# Patient Record
Sex: Male | Born: 1995 | Race: Black or African American | Hispanic: No | Marital: Single | Smoking: Never smoker
Health system: Southern US, Community
[De-identification: ages and names within clinical notes are randomized; demographics above are authoritative.]

## PROBLEM LIST (undated history)

## (undated) DIAGNOSIS — J45909 Unspecified asthma, uncomplicated: Secondary | ICD-10-CM

## (undated) DIAGNOSIS — R933 Abnormal findings on diagnostic imaging of other parts of digestive tract: Secondary | ICD-10-CM

## (undated) DIAGNOSIS — K5090 Crohn's disease, unspecified, without complications: Secondary | ICD-10-CM

---

## 2016-08-14 ENCOUNTER — Encounter (HOSPITAL_COMMUNITY): Payer: Self-pay | Admitting: Emergency Medicine

## 2016-08-14 ENCOUNTER — Ambulatory Visit (HOSPITAL_COMMUNITY)
Admission: EM | Admit: 2016-08-14 | Discharge: 2016-08-14 | Disposition: A | Discharge status: Home / Self Care | Payer: 59 | Attending: Family Medicine | Admitting: Family Medicine

## 2016-08-14 DIAGNOSIS — J029 Acute pharyngitis, unspecified: Secondary | ICD-10-CM | POA: Diagnosis not present

## 2016-08-14 HISTORY — DX: Unspecified asthma, uncomplicated: J45.909

## 2016-08-14 LAB — POCT RAPID STREP A: Streptococcus, Group A Screen (Direct): NEGATIVE

## 2016-08-14 MED ORDER — AMOXICILLIN 500 MG PO CAPS: 1000.0000 mg | ORAL_CAPSULE | Freq: Two times a day (BID) | ORAL | 0 refills | Status: DC

## 2016-08-14 NOTE — ED Provider Notes (Signed)
CSN: [id number]     Arrival date & time 08/14/16  1613 History   First MD Initiated Contact with Patient 08/14/16 1704     Chief Complaint  Patient presents with  . Sore Throat   (Consider location/radiation/quality/duration/timing/severity/associated sxs/prior Treatment) 20 year old male presents to urgent care complaining of a swollen tonsil, headache, fatigue, sore throat started yesterday. Denies known fevers.      Past Medical History:  Diagnosis Date  . Asthma    History reviewed. No pertinent surgical history. No family history on file. Social History  Substance Use Topics  . Smoking status: Never Smoker  . Smokeless tobacco: Never Used  . Alcohol use No    Review of Systems  Constitutional: Positive for activity change and fever.  HENT: Positive for sore throat and trouble swallowing. Negative for congestion, postnasal drip and sinus pressure.   Eyes: Negative.   Respiratory: Negative.   Gastrointestinal: Negative.   Skin: Negative.   All other systems reviewed and are negative.   Allergies  Review of patient's allergies indicates no known allergies.  Home Medications   Prior to Admission medications   Medication Sig Start Date End Date Taking? Authorizing Provider  amoxicillin (AMOXIL) 500 MG capsule Take 2 capsules (1,000 mg total) by mouth 2 (two) times daily. 08/14/16   Hayden Rasmussenavid Elta Angell, NP   Meds Ordered and Administered this Visit  Medications - No data to display  BP 129/89 (BP Location: Left Arm)   Pulse 100   Temp 99.8 F (37.7 C) (Oral)   Resp 16   SpO2 98%  No data found.   Physical Exam  Constitutional: He appears well-developed and well-nourished. No distress.  HENT:  Head: Normocephalic and atraumatic.  Bilateral TMs are normal. Oropharynx with deep erythema and few small exudates. Mildly enlarged erythematous tonsils.  Eyes: EOM are normal. Pupils are equal, round, and reactive to light.  Neck: Normal range of motion. Neck supple.   Cardiovascular: Normal rate and regular rhythm.   Pulmonary/Chest: Effort normal and breath sounds normal.  Musculoskeletal: Normal range of motion.  Skin: Skin is warm and dry.  Nursing note and vitals reviewed.   Urgent Care Course   Clinical Course    Procedures (including critical care time)  Labs Review Labs Reviewed  POCT RAPID STREP A   Results for orders placed or performed during the hospital encounter of 08/14/16  POCT rapid strep A [facility])  Result Value Ref Range   Streptococcus, Group A Screen (Direct) NEGATIVE NEGATIVE     Imaging Review No results found.   Visual Acuity Review  Right Eye Distance:   Left Eye Distance:   Bilateral Distance:    Right Eye Near:   Left Eye Near:    Bilateral Near:         MDM   1. Pharyngitis   2. Sore throat    Ibuprofen 600 mg every 6 hours as needed for pain or fever Plenty of cool liquids Meds ordered this encounter  Medications  . amoxicillin (AMOXIL) 500 MG capsule    Sig: Take 2 capsules (1,000 mg total) by mouth 2 (two) times daily.    Dispense:  32 capsule    Refill:  0    Order Specific Question:   Supervising Provider    Answer:   Linna HoffKINDL, JAMES D [[id number]]       Hayden Rasmussenavid Axzel [place], NP 08/14/16 1716    Hayden Rasmussenavid Tysheka Fanguy, NP 08/14/16 1718

## 2016-08-14 NOTE — Discharge Instructions (Signed)
Ibuprofen 600 mg every 6 hours as needed for pain or fever Plenty of cool liquids

## 2016-08-17 LAB — CULTURE, GROUP A STREP (THRC)

## 2016-12-27 ENCOUNTER — Emergency Department (HOSPITAL_COMMUNITY)
Admission: EM | Admit: 2016-12-27 | Discharge: 2016-12-28 | Disposition: A | Discharge status: Home / Self Care | Payer: 59 | Attending: Emergency Medicine | Admitting: Emergency Medicine

## 2016-12-27 ENCOUNTER — Encounter (HOSPITAL_COMMUNITY): Payer: Self-pay | Admitting: Emergency Medicine

## 2016-12-27 DIAGNOSIS — Z79899 Other long term (current) drug therapy: Secondary | ICD-10-CM | POA: Insufficient documentation

## 2016-12-27 DIAGNOSIS — J45909 Unspecified asthma, uncomplicated: Secondary | ICD-10-CM | POA: Diagnosis not present

## 2016-12-27 DIAGNOSIS — K5090 Crohn's disease, unspecified, without complications: Secondary | ICD-10-CM | POA: Diagnosis not present

## 2016-12-27 DIAGNOSIS — R1030 Lower abdominal pain, unspecified: Secondary | ICD-10-CM | POA: Diagnosis not present

## 2016-12-27 HISTORY — DX: Crohn's disease, unspecified, without complications: K50.90

## 2016-12-27 HISTORY — DX: Abnormal findings on diagnostic imaging of other parts of digestive tract: R93.3

## 2016-12-27 LAB — COMPREHENSIVE METABOLIC PANEL
ALBUMIN: 3.9 g/dL (ref 3.5–5.0)
ALT: 12 U/L — ABNORMAL LOW (ref 17–63)
AST: 16 U/L (ref 15–41)
Alkaline Phosphatase: 62 U/L (ref 38–126)
Anion gap: 10 (ref 5–15)
BUN: 6 mg/dL (ref 6–20)
CHLORIDE: 101 mmol/L (ref 101–111)
CO2: 27 mmol/L (ref 22–32)
Calcium: 9.5 mg/dL (ref 8.9–10.3)
Creatinine, Ser: 1.04 mg/dL (ref 0.61–1.24)
GFR calc Af Amer: >60 mL/min (ref >60)
GFR calc non Af Amer: >60 mL/min (ref >60)
GLUCOSE: 133 mg/dL — AB (ref 65–99)
POTASSIUM: 3.4 mmol/L — AB (ref 3.5–5.1)
SODIUM: 138 mmol/L (ref 135–145)
Total Bilirubin: 1.1 mg/dL (ref 0.3–1.2)
Total Protein: 6.9 g/dL (ref 6.5–8.1)

## 2016-12-27 LAB — CBC
HEMATOCRIT: 41.7 % (ref 39.0–52.0)
Hemoglobin: 14 g/dL (ref 13.0–17.0)
MCH: 26.6 pg (ref 26.0–34.0)
MCHC: 33.6 g/dL (ref 30.0–36.0)
MCV: 79.3 fL (ref 78.0–100.0)
Platelets: 313 10*3/uL (ref 150–400)
RBC: 5.26 MIL/uL (ref 4.22–5.81)
RDW: 13 % (ref 11.5–15.5)
WBC: 7.6 10*3/uL (ref 4.0–10.5)

## 2016-12-27 LAB — LIPASE, BLOOD: LIPASE: 26 U/L (ref 11–51)

## 2016-12-27 MED ORDER — MORPHINE SULFATE (PF) 4 MG/ML IV SOLN
4.0000 mg | Freq: Once | INTRAVENOUS | Status: AC
  Administered 2016-12-27: 4 mg via INTRAVENOUS
  Filled 2016-12-27: qty 1

## 2016-12-27 MED ORDER — IOPAMIDOL (ISOVUE-300) INJECTION 61%
INTRAVENOUS | Status: AC
  Administered 2016-12-28: 100 mL
  Filled 2016-12-27: qty 30

## 2016-12-27 MED ORDER — ONDANSETRON HCL 4 MG/2ML IJ SOLN
4.0000 mg | Freq: Once | INTRAMUSCULAR | Status: AC
  Administered 2016-12-27: 4 mg via INTRAVENOUS
  Filled 2016-12-27: qty 2

## 2016-12-27 MED ORDER — SODIUM CHLORIDE 0.9 % IV BOLUS (SEPSIS)
1000.0000 mL | Freq: Once | INTRAVENOUS | Status: AC
  Administered 2016-12-27: 1000 mL via INTRAVENOUS

## 2016-12-27 NOTE — ED Triage Notes (Signed)
Pt recently dx with crohn's ds last week. Having abd pain, dizziness, and weakness-- not eating much, having diarrhea with blood.

## 2016-12-27 NOTE — ED Provider Notes (Signed)
[facility] Provider Note   CSN: [id number] Arrival date & time: 12/27/16  1630     History   Chief Complaint Chief Complaint  Patient presents with  . Crohn's Disease  . Abdominal Pain    HPI Jared Marshall is a 21 y.o. male.  Patient is a 21 year old male who was recently diagnosed with Crohn's disease. He states he's had intermittent abdominal pain and diarrhea for years but has become worse in the last few months. he's had gastroenterology and was started on prednisone and Bentyl on Friday when he was diagnosed with Crohn's disease. He is continued to have worsening lower abdominal pain, vomiting and bloody diarrhea all week.  The pain is sharp and 10 out of 10 at times. It is in the same location that it normally is more severe. He spoke with GI today who recommended he come here for further evaluation. He denies fever or urinary symptoms. Has only been able to eat a few crackers or bread intermittently.   The history is provided by the patient.  Abdominal Pain   This is a recurrent problem.    Past Medical History:  Diagnosis Date  . Abnormal colonoscopy   . Asthma   . Crohn's disease (HCC)     There are no active problems to display for this patient.   History reviewed. No pertinent surgical history.     Home Medications    Prior to Admission medications   Medication Sig Start Date End Date Taking? Authorizing Provider  amoxicillin (AMOXIL) 500 MG capsule Take 2 capsules (1,000 mg total) by mouth 2 (two) times daily. 08/14/16   Hayden Rasmussen, NP    Family History No family history on file.  Social History Social History  Substance Use Topics  . Smoking status: Never Smoker  . Smokeless tobacco: Never Used  . Alcohol use No     Allergies   Patient has no known allergies.   Review of Systems Review of Systems  Gastrointestinal: Positive for abdominal pain.  All other systems reviewed and are negative.    Physical Exam Updated Vital  Signs BP 137/95 (BP Location: Left Arm)   Pulse 69   Temp 98.5 F (36.9 C) (Oral)   Resp 18   Ht 5\' 10"  (1.778 m)   Wt 156 lb (70.8 kg)   SpO2 100%   BMI 22.38 kg/m   Physical Exam  Constitutional: He is oriented to person, place, and time. He appears well-developed and well-nourished. No distress.  HENT:  Head: Normocephalic and atraumatic.  Mouth/Throat: Oropharynx is clear and moist.  Eyes: Conjunctivae and EOM are normal. Pupils are equal, round, and reactive to light.  Neck: Normal range of motion. Neck supple.  Cardiovascular: Normal rate, regular rhythm and intact distal pulses.   No murmur heard. Pulmonary/Chest: Effort normal and breath sounds normal. No respiratory distress. He has no wheezes. He has no rales.  Abdominal: Soft. He exhibits no distension. There is tenderness in the suprapubic area and left lower quadrant. There is guarding. There is no rebound.    Musculoskeletal: Normal range of motion. He exhibits no edema or tenderness.  Neurological: He is alert and oriented to person, place, and time.  Skin: Skin is warm and dry. No rash noted. No erythema.  Psychiatric: He has a normal mood and affect. His behavior is normal.  Nursing note and vitals reviewed.    ED Treatments / Results  Labs (all labs ordered are listed, but only abnormal results are displayed)  Labs Reviewed  COMPREHENSIVE METABOLIC PANEL - Abnormal; Notable for the following:       Result Value   Potassium 3.4 (*)    Glucose, Bld 133 (*)    ALT 12 (*)    All other components within normal limits  LIPASE, BLOOD  CBC  URINALYSIS, ROUTINE W REFLEX MICROSCOPIC    EKG  EKG Interpretation None       Radiology No results found.  Procedures Procedures (including critical care time)  Medications Ordered in ED Medications  ondansetron (ZOFRAN) injection 4 mg (not administered)  morphine 4 MG/ML injection 4 mg (not administered)  sodium chloride 0.9 % bolus 1,000 mL (not  administered)     Initial Impression / Assessment and Plan / ED Course  I have reviewed the triage vital signs and the nursing notes.  Pertinent labs & imaging results that were available during my care of the patient were reviewed by me and considered in my medical decision making (see chart for details).  Clinical Course    Patient is a 21 year old male with a history of Crohn's disease presenting today with a Crohn's flare. He has been taking prednisone and Bentyl without improvement of symptoms and he continues to have diarrhea and vomiting. Patient has significant lower abdominal tenderness despite starting the prednisone. He has had vomiting and diarrhea today.  Labs without significant findings and patient is afebrile. Will do a CT for further evaluation. Patient given IV fluids pain and nausea medication.  11:51 PM Pt checked out to Dr. Mora Bellman  Final Clinical Impressions(s) / ED Diagnoses   Final diagnoses:  None    New Prescriptions New Prescriptions   No medications on file     Gwyneth Sprout, MD 12/27/16 2351

## 2016-12-28 ENCOUNTER — Encounter (HOSPITAL_COMMUNITY): Payer: Self-pay

## 2016-12-28 ENCOUNTER — Emergency Department (HOSPITAL_COMMUNITY): Payer: 59

## 2016-12-28 MED ORDER — MORPHINE SULFATE (PF) 4 MG/ML IV SOLN
4.0000 mg | Freq: Once | INTRAVENOUS | Status: AC
  Administered 2016-12-28: 4 mg via INTRAVENOUS
  Filled 2016-12-28: qty 1

## 2016-12-28 MED ORDER — METRONIDAZOLE 500 MG PO TABS
500.0000 mg | ORAL_TABLET | Freq: Three times a day (TID) | ORAL | 0 refills | Status: DC
Start: 2016-12-28 — End: 2017-01-30

## 2016-12-28 MED ORDER — CIPROFLOXACIN HCL 500 MG PO TABS: 500.0000 mg | ORAL_TABLET | Freq: Two times a day (BID) | ORAL | 0 refills | Status: DC

## 2016-12-28 MED ORDER — IOPAMIDOL (ISOVUE-300) INJECTION 61%
INTRAVENOUS | Status: AC
  Administered 2016-12-28: 03:00:00 via INTRAVENOUS
  Filled 2016-12-28: qty 100

## 2016-12-28 NOTE — ED Notes (Signed)
PO contrast finished, CT notified.

## 2016-12-28 NOTE — ED Provider Notes (Signed)
I was signed out patient as pending CT scan for evaluation.  CT reveals inflammation.  This could be from Crohns or from an infectious source.  Likely Crohns and patient is on prednisone.  He may just need continued steroids.  I will give rx for abx in case this does not improve in the next couple of days.  He demonstrates good understanding of the plan.  He appears well and in NAD. VS remain within his normal limits and she is safe for DC.   Tomasita Crumble, MD 12/28/16 [phone]

## 2017-01-30 ENCOUNTER — Emergency Department (HOSPITAL_COMMUNITY): Payer: 59

## 2017-01-30 ENCOUNTER — Emergency Department (HOSPITAL_COMMUNITY): Payer: 59 | Admitting: Certified Registered Nurse Anesthetist

## 2017-01-30 ENCOUNTER — Inpatient Hospital Stay (HOSPITAL_COMMUNITY)
Admission: EM | Admit: 2017-01-30 | Discharge: 2017-02-02 | DRG: 345 | Disposition: A | Discharge status: Home / Self Care | Payer: 59 | Attending: Surgery | Admitting: Surgery

## 2017-01-30 ENCOUNTER — Encounter (HOSPITAL_COMMUNITY): Admission: EM | Disposition: A | Discharge status: Home / Self Care | Payer: Self-pay | Source: Home / Self Care

## 2017-01-30 ENCOUNTER — Encounter (HOSPITAL_COMMUNITY): Payer: Self-pay | Admitting: Emergency Medicine

## 2017-01-30 DIAGNOSIS — K612 Anorectal abscess: Secondary | ICD-10-CM | POA: Diagnosis not present

## 2017-01-30 DIAGNOSIS — K610 Anal abscess: Secondary | ICD-10-CM

## 2017-01-30 DIAGNOSIS — Z79899 Other long term (current) drug therapy: Secondary | ICD-10-CM

## 2017-01-30 DIAGNOSIS — K611 Rectal abscess: Secondary | ICD-10-CM | POA: Diagnosis present

## 2017-01-30 DIAGNOSIS — K5900 Constipation, unspecified: Secondary | ICD-10-CM | POA: Diagnosis present

## 2017-01-30 DIAGNOSIS — D649 Anemia, unspecified: Secondary | ICD-10-CM | POA: Diagnosis present

## 2017-01-30 DIAGNOSIS — K5090 Crohn's disease, unspecified, without complications: Secondary | ICD-10-CM | POA: Diagnosis present

## 2017-01-30 DIAGNOSIS — Z7952 Long term (current) use of systemic steroids: Secondary | ICD-10-CM

## 2017-01-30 HISTORY — PX: INCISION AND DRAINAGE PERIRECTAL ABSCESS: SHX1804

## 2017-01-30 LAB — CBC WITH DIFFERENTIAL/PLATELET
Basophils Absolute: 0 10*3/uL (ref 0.0–0.1)
Basophils Relative: 0 %
EOS ABS: 0 10*3/uL (ref 0.0–0.7)
EOS PCT: 0 %
HCT: 28.5 % — ABNORMAL LOW (ref 39.0–52.0)
Hemoglobin: 8.8 g/dL — ABNORMAL LOW (ref 13.0–17.0)
LYMPHS ABS: 1.8 10*3/uL (ref 0.7–4.0)
Lymphocytes Relative: 10 %
MCH: 23.8 pg — AB (ref 26.0–34.0)
MCHC: 30.9 g/dL (ref 30.0–36.0)
MCV: 77 fL — ABNORMAL LOW (ref 78.0–100.0)
MONOS PCT: 9 %
Monocytes Absolute: 1.6 10*3/uL — ABNORMAL HIGH (ref 0.1–1.0)
Neutro Abs: 15.4 10*3/uL — ABNORMAL HIGH (ref 1.7–7.7)
Neutrophils Relative %: 81 %
PLATELETS: 361 10*3/uL (ref 150–400)
RBC: 3.7 MIL/uL — ABNORMAL LOW (ref 4.22–5.81)
RDW: 14.5 % (ref 11.5–15.5)
WBC: 18.9 10*3/uL — ABNORMAL HIGH (ref 4.0–10.5)

## 2017-01-30 LAB — BASIC METABOLIC PANEL
Anion gap: 6 (ref 5–15)
BUN: 7 mg/dL (ref 6–20)
CO2: 30 mmol/L (ref 22–32)
CREATININE: 0.85 mg/dL (ref 0.61–1.24)
Calcium: 8.6 mg/dL — ABNORMAL LOW (ref 8.9–10.3)
Chloride: 103 mmol/L (ref 101–111)
GFR calc Af Amer: >60 mL/min (ref >60)
Glucose, Bld: 83 mg/dL (ref 65–99)
Potassium: 3.3 mmol/L — ABNORMAL LOW (ref 3.5–5.1)
SODIUM: 139 mmol/L (ref 135–145)

## 2017-01-30 SURGERY — INCISION AND DRAINAGE, ABSCESS, PERIRECTAL
Anesthesia: General

## 2017-01-30 MED ORDER — PIPERACILLIN-TAZOBACTAM 3.375 G IVPB
INTRAVENOUS | Status: AC
  Filled 2017-01-30: qty 50

## 2017-01-30 MED ORDER — MORPHINE SULFATE (PF) 4 MG/ML IV SOLN
1.0000 mg | INTRAVENOUS | Status: DC | PRN
  Administered 2017-01-30 – 2017-01-31 (×3): 1 mg via INTRAVENOUS
  Filled 2017-01-30 (×3): qty 1

## 2017-01-30 MED ORDER — PROCHLORPERAZINE EDISYLATE 5 MG/ML IJ SOLN: 10.0000 mg | Freq: Once | INTRAMUSCULAR | Status: DC

## 2017-01-30 MED ORDER — SODIUM CHLORIDE 0.9 % IJ SOLN
INTRAMUSCULAR | Status: AC
  Filled 2017-01-30: qty 50

## 2017-01-30 MED ORDER — FENTANYL CITRATE (PF) 100 MCG/2ML IJ SOLN
INTRAMUSCULAR | Status: DC | PRN
  Administered 2017-01-30 (×2): 100 ug via INTRAVENOUS

## 2017-01-30 MED ORDER — MIDAZOLAM HCL 5 MG/5ML IJ SOLN
INTRAMUSCULAR | Status: DC | PRN
  Administered 2017-01-30: 2 mg via INTRAVENOUS

## 2017-01-30 MED ORDER — PROPOFOL 10 MG/ML IV BOLUS
INTRAVENOUS | Status: DC | PRN
  Administered 2017-01-30: 200 mg via INTRAVENOUS

## 2017-01-30 MED ORDER — LIDOCAINE 2% (20 MG/ML) 5 ML SYRINGE
INTRAMUSCULAR | Status: DC | PRN
  Administered 2017-01-30: 50 mg via INTRAVENOUS

## 2017-01-30 MED ORDER — IOPAMIDOL (ISOVUE-300) INJECTION 61%
INTRAVENOUS | Status: AC
  Filled 2017-01-30: qty 100

## 2017-01-30 MED ORDER — MIDAZOLAM HCL 2 MG/2ML IJ SOLN
INTRAMUSCULAR | Status: AC
  Filled 2017-01-30: qty 2

## 2017-01-30 MED ORDER — FENTANYL CITRATE (PF) 100 MCG/2ML IJ SOLN
INTRAMUSCULAR | Status: AC
  Filled 2017-01-30: qty 2

## 2017-01-30 MED ORDER — PIPERACILLIN-TAZOBACTAM 3.375 G IVPB
3.3750 g | Freq: Three times a day (TID) | INTRAVENOUS | Status: DC
  Administered 2017-01-31 – 2017-02-02 (×7): 3.375 g via INTRAVENOUS
  Filled 2017-01-30 (×9): qty 50

## 2017-01-30 MED ORDER — IOPAMIDOL (ISOVUE-300) INJECTION 61%
30.0000 mL | Freq: Once | INTRAVENOUS | Status: AC
  Administered 2017-01-30: 30 mL via ORAL

## 2017-01-30 MED ORDER — PROMETHAZINE HCL 25 MG/ML IJ SOLN: 6.2500 mg | INTRAMUSCULAR | Status: DC | PRN

## 2017-01-30 MED ORDER — DIPHENHYDRAMINE HCL 25 MG PO CAPS
25.0000 mg | ORAL_CAPSULE | Freq: Once | ORAL | Status: AC
  Administered 2017-01-30: 25 mg via ORAL
  Filled 2017-01-30: qty 1

## 2017-01-30 MED ORDER — PROPOFOL 10 MG/ML IV BOLUS
INTRAVENOUS | Status: AC
  Filled 2017-01-30: qty 20

## 2017-01-30 MED ORDER — DEXMEDETOMIDINE HCL 200 MCG/2ML IV SOLN
INTRAVENOUS | Status: DC | PRN
  Administered 2017-01-30: 20 ug via INTRAVENOUS

## 2017-01-30 MED ORDER — ONDANSETRON HCL 4 MG/2ML IJ SOLN
INTRAMUSCULAR | Status: DC | PRN
  Administered 2017-01-30: 4 mg via INTRAVENOUS

## 2017-01-30 MED ORDER — PROCHLORPERAZINE MALEATE 5 MG PO TABS
5.0000 mg | ORAL_TABLET | Freq: Once | ORAL | Status: AC
Start: 2017-01-30 — End: 2017-01-30
  Administered 2017-01-30: 5 mg via ORAL
  Filled 2017-01-30: qty 1

## 2017-01-30 MED ORDER — ACETAMINOPHEN 500 MG PO TABS
500.0000 mg | ORAL_TABLET | Freq: Once | ORAL | Status: AC
  Administered 2017-01-30: 500 mg via ORAL
  Filled 2017-01-30: qty 1

## 2017-01-30 MED ORDER — LIP MEDEX EX OINT
TOPICAL_OINTMENT | CUTANEOUS | Status: AC
  Filled 2017-01-30: qty 7

## 2017-01-30 MED ORDER — IOPAMIDOL (ISOVUE-300) INJECTION 61%
INTRAVENOUS | Status: AC
  Filled 2017-01-30: qty 30

## 2017-01-30 MED ORDER — SODIUM CHLORIDE 0.9 % IV BOLUS (SEPSIS)
1000.0000 mL | Freq: Once | INTRAVENOUS | Status: AC
  Administered 2017-01-30: 1000 mL via INTRAVENOUS

## 2017-01-30 MED ORDER — PIPERACILLIN-TAZOBACTAM 3.375 G IVPB
3.3750 g | Freq: Once | INTRAVENOUS | Status: AC
  Administered 2017-01-30: 3.375 g via INTRAVENOUS

## 2017-01-30 MED ORDER — IOPAMIDOL (ISOVUE-300) INJECTION 61%
100.0000 mL | Freq: Once | INTRAVENOUS | Status: AC | PRN
  Administered 2017-01-30: 100 mL via INTRAVENOUS

## 2017-01-30 MED ORDER — SUCCINYLCHOLINE CHLORIDE 200 MG/10ML IV SOSY
PREFILLED_SYRINGE | INTRAVENOUS | Status: DC | PRN
  Administered 2017-01-30: 100 mg via INTRAVENOUS

## 2017-01-30 MED ORDER — LACTATED RINGERS IV SOLN
INTRAVENOUS | Status: DC | PRN
  Administered 2017-01-30 (×2): via INTRAVENOUS

## 2017-01-30 MED ORDER — PREDNISONE 20 MG PO TABS
20.0000 mg | ORAL_TABLET | Freq: Two times a day (BID) | ORAL | Status: DC
  Administered 2017-01-31: 20 mg via ORAL
  Filled 2017-01-30: qty 1

## 2017-01-30 MED ORDER — OXYCODONE HCL 5 MG PO TABS
5.0000 mg | ORAL_TABLET | Freq: Once | ORAL | Status: AC
  Administered 2017-01-30: 5 mg via ORAL
  Filled 2017-01-30: qty 1

## 2017-01-30 MED ORDER — DEXMEDETOMIDINE HCL IN NACL 200 MCG/50ML IV SOLN
INTRAVENOUS | Status: AC
  Filled 2017-01-30: qty 50

## 2017-01-30 MED ORDER — HYDROMORPHONE HCL 1 MG/ML IJ SOLN: 0.2500 mg | INTRAMUSCULAR | Status: DC | PRN

## 2017-01-30 SURGICAL SUPPLY — 18 items
BLADE SURG SZ20 CARB STEEL (BLADE) IMPLANT
COVER SURGICAL LIGHT HANDLE (MISCELLANEOUS) IMPLANT
DRAPE SHEET LG 3/4 BI-LAMINATE (DRAPES) ×2 IMPLANT
ELECT PENCIL ROCKER SW 15FT (MISCELLANEOUS) IMPLANT
GAUZE IODOFORM PACK 1/2 7832 (GAUZE/BANDAGES/DRESSINGS) IMPLANT
GAUZE SPONGE 4X4 12PLY STRL (GAUZE/BANDAGES/DRESSINGS) IMPLANT
GAUZE SPONGE 4X4 16PLY XRAY LF (GAUZE/BANDAGES/DRESSINGS) IMPLANT
GLOVE BIOGEL M 8.0 STRL (GLOVE) ×2 IMPLANT
GLOVE BIOGEL PI IND STRL 7.0 (GLOVE) IMPLANT
GLOVE BIOGEL PI INDICATOR 7.0 (GLOVE)
GOWN SPEC L4 XLG W/TWL (GOWN DISPOSABLE) IMPLANT
GOWN STRL REUS W/TWL LRG LVL3 (GOWN DISPOSABLE) ×4 IMPLANT
GOWN STRL REUS W/TWL XL LVL3 (GOWN DISPOSABLE) IMPLANT
KIT BASIN OR (CUSTOM PROCEDURE TRAY) ×2 IMPLANT
PACK LITHOTOMY IV (CUSTOM PROCEDURE TRAY) ×2 IMPLANT
SWAB CULTURE ESWAB REG 1ML (MISCELLANEOUS) ×2 IMPLANT
TOWEL OR 17X26 10 PK STRL BLUE (TOWEL DISPOSABLE) ×2 IMPLANT
YANKAUER SUCT BULB TIP 10FT TU (MISCELLANEOUS) IMPLANT

## 2017-01-30 NOTE — Anesthesia Postprocedure Evaluation (Signed)
Anesthesia Post Note  Patient: Jared Marshall  Procedure(s) Performed: Procedure(s) (LRB): EXAM UNDER ANESTHESIA, IRRIGATION AND DEBRIDEMENT PERIRECTAL ABSCESS (N/A)  Anesthesia Type: General       Last Vitals:  Vitals:   01/30/17 2045 01/30/17 2100  BP: (!) 110/57 128/83  Pulse: 88 98  Resp: 14 15  Temp: 37 C 37.5 C    Last Pain:  Vitals:   01/30/17 2045  PainSc: 0-No pain                 Kennieth Rad

## 2017-01-30 NOTE — ED Notes (Signed)
Pt transported to OR

## 2017-01-30 NOTE — ED Triage Notes (Signed)
Per pt, states he has a history of hemorrhoids-states increased rectal pain-states he is not sure if his pain is from his hemorrhoids-states he has a headache as well

## 2017-01-30 NOTE — Anesthesia Procedure Notes (Signed)
Procedure Name: Intubation Performed by: Gean Maidens Pre-anesthesia Checklist: Patient identified, Emergency Drugs available, Suction available, Patient being monitored and Timeout performed Patient Re-evaluated:Patient Re-evaluated prior to inductionOxygen Delivery Method: Circle system utilized Preoxygenation: Pre-oxygenation with 100% oxygen Intubation Type: IV induction Ventilation: Mask ventilation without difficulty Laryngoscope Size: Mac and 4 Grade View: Grade I Tube type: Oral Tube size: 7.5 mm Number of attempts: 1 Airway Equipment and Method: Stylet Placement Confirmation: ETT inserted through vocal cords under direct vision,  positive ETCO2,  CO2 detector and breath sounds checked- equal and bilateral Secured at: 23 cm Tube secured with: Tape Dental Injury: Teeth and Oropharynx as per pre-operative assessment

## 2017-01-30 NOTE — ED Notes (Signed)
Patient transported to CT 

## 2017-01-30 NOTE — Op Note (Signed)
Surgeon: Wenda Low, MD, FACS  Asst:  none  Anes:  general  Preop Dx: Perirectal abscess Postop Dx: Horseshoe abscess  Procedure: Incision and drainage of horseshoe abscess Location Surgery: [facility] #6 Complications: none  EBL:   20 cc  Drains: 1/2 " iodophor packing up into Horseshoe cavity  Description of Procedure:  The patient was taken to OR 6 .  After anesthesia was administered and the patient was prepped a timeout was performed.  Patient in lithotomy.  18 gauge needle inserted and 2 cc of pus was aspirated and sent for culture.  Radial incision on the right and I entered the abscess cavity posteriorly with my finger.  The anatomy conformed to the CT scan.  I packed this with 1/2 inch Iodophor.  Dressing applied and patient taken to the PACU.    The patient tolerated the procedure well and was taken to the PACU in stable condition.     Matt B. Daphine Deutscher, MD, [facility], [place] [phone]

## 2017-01-30 NOTE — Anesthesia Preprocedure Evaluation (Addendum)
Anesthesia Evaluation  Patient identified by MRN, date of birth, ID band Patient awake    Reviewed: Allergy & Precautions, NPO status , Patient's Chart, lab work & pertinent test results  Airway Mallampati: II  TM Distance: >3 FB Neck ROM: Full    Dental  (+) Dental Advisory Given   Pulmonary asthma ,    breath sounds clear to auscultation       Cardiovascular negative cardio ROS   Rhythm:Regular Rate:Normal     Neuro/Psych negative neurological ROS     GI/Hepatic Neg liver ROS, Crohns dz   Endo/Other  negative endocrine ROS  Renal/GU negative Renal ROS     Musculoskeletal   Abdominal   Peds  Hematology  (+) anemia ,   Anesthesia Other Findings   Reproductive/Obstetrics                            Lab Results  Component Value Date   WBC 18.9 (H) 01/30/2017   HGB 8.8 (L) 01/30/2017   HCT 28.5 (L) 01/30/2017   MCV 77.0 (L) 01/30/2017   PLT 361 01/30/2017   Lab Results  Component Value Date   CREATININE 0.85 01/30/2017   BUN 7 01/30/2017   NA 139 01/30/2017   K 3.3 (L) 01/30/2017   CL 103 01/30/2017   CO2 30 01/30/2017    Anesthesia Physical Anesthesia Plan  ASA: II  Anesthesia Plan: General   Post-op Pain Management:    Induction: Intravenous  Airway Management Planned: Oral ETT  Additional Equipment:   Intra-op Plan:   Post-operative Plan: Extubation in OR  Informed Consent: I have reviewed the patients History and Physical, chart, labs and discussed the procedure including the risks, benefits and alternatives for the proposed anesthesia with the patient or authorized representative who has indicated his/her understanding and acceptance.   Dental advisory given  Plan Discussed with: CRNA  Anesthesia Plan Comments:         Anesthesia Quick Evaluation

## 2017-01-30 NOTE — Progress Notes (Signed)
Patient c/o nausea and headache. Notified MD for medication.

## 2017-01-30 NOTE — Transfer of Care (Signed)
Immediate Anesthesia Transfer of Care Note  Patient: Jared Marshall  Procedure(s) Performed: Procedure(s): EXAM UNDER ANESTHESIA, IRRIGATION AND DEBRIDEMENT PERIRECTAL ABSCESS (N/A)  Patient Location: PACU  Anesthesia Type:General  Level of Consciousness: sedated, patient cooperative and responds to stimulation  Airway & Oxygen Therapy: Patient Spontanous Breathing and Patient connected to face mask oxygen  Post-op Assessment: Report given to RN and Post -op Vital signs reviewed and stable  Post vital signs: Reviewed and stable  Last Vitals:  Vitals:   01/30/17 1121  BP: 139/85  Pulse: 98  Resp: 16  Temp: 36.8 C    Last Pain:  Vitals:   01/30/17 1539  PainSc: Asleep         Complications: No apparent anesthesia complications

## 2017-01-30 NOTE — H&P (Signed)
Chief Complaint:  Bottom hurts.  Recently diagnosed with Crohn's   History of Present Illness:  Jared Marshall is an 21 y.o. male presented to the ER with a very tender bottom.  Seen in fast track.  CT shows a horseshoe abscess.    Past Medical History:  Diagnosis Date  . Abnormal colonoscopy   . Asthma   . Crohn's disease ([place])     History reviewed. No pertinent surgical history.  Current Facility-Administered Medications  Medication Dose Route Frequency Provider Last Rate Last Dose  . iopamidol (ISOVUE-300) 61 % injection           . iopamidol (ISOVUE-300) 61 % injection           . piperacillin-tazobactam (ZOSYN) IVPB 3.375 g  3.375 g Intravenous Once Johnathan Hausen, MD      . sodium chloride 0.9 % injection            Current Outpatient Prescriptions  Medication Sig Dispense Refill  . ciprofloxacin (CIPRO) 500 MG tablet Take 1 tablet (500 mg total) by mouth 2 (two) times daily. One po bid x 7 days 14 tablet 0  . dicyclomine (BENTYL) 10 MG capsule Take 10 mg by mouth daily.    . metroNIDAZOLE (FLAGYL) 500 MG tablet Take 1 tablet (500 mg total) by mouth 3 (three) times daily. One po bid x 7 days 21 tablet 0  . predniSONE (DELTASONE) 20 MG tablet Take 20 mg by mouth 2 (two) times daily with a meal.     Patient has no known allergies. History reviewed. No pertinent family history. Social History:   reports that he has never smoked. He has never used smokeless tobacco. He reports that he does not drink alcohol or use drugs.   REVIEW OF SYSTEMS : Negative except for Crohn's   Physical Exam:   Blood pressure 139/85, pulse 98, temperature 98.2 F (36.8 C), resp. rate 16, height 5' 10"  (1.778 m), weight 72.6 kg (160 lb), SpO2 99 %. Body mass index is 22.96 kg/m.  Gen:  WDWN AAM NAD  Neurological: Alert and oriented to person, place, and time. Motor and sensory function is grossly intact  Head: Normocephalic and atraumatic.  Eyes: Conjunctivae are normal. Pupils are equal, round,  and reactive to light. No scleral icterus.  Neck: Normal range of motion. Neck supple. No tracheal deviation or thyromegaly present.  Cardiovascular:  SR without murmurs or gallops.  No carotid bruits Breast:  Not examined  Respiratory: Effort normal.  No respiratory distress. No chest wall tenderness. Breath sounds normal.  No wheezes, rales or rhonchi.  Abdomen:  nontender GU:  Bottom tender with guarding Musculoskeletal: Normal range of motion. Extremities are nontender. No cyanosis, edema or clubbing noted Lymphadenopathy: No cervical, preauricular, postauricular or axillary adenopathy is present Skin: Skin is warm and dry. No rash noted. No diaphoresis. No erythema. No pallor. Pscyh: Normal mood and affect. Behavior is normal. Judgment and thought content normal.   LABORATORY RESULTS: Results for orders placed or performed during the hospital encounter of 01/30/17 (from the past 48 hour(s))  CBC with Differential     Status: Abnormal   Collection Time: 01/30/17  4:55 PM  Result Value Ref Range   WBC 18.9 (H) 4.0 - 10.5 K/uL   RBC 3.70 (L) 4.22 - 5.81 MIL/uL   Hemoglobin 8.8 (L) 13.0 - 17.0 g/dL   HCT 28.5 (L) 39.0 - 52.0 %   MCV 77.0 (L) 78.0 - 100.0 fL   MCH 23.8 (L)  26.0 - 34.0 pg   MCHC 30.9 30.0 - 36.0 g/dL   RDW 14.5 11.5 - 15.5 %   Platelets 361 150 - 400 K/uL   Neutrophils Relative % 81 %   Neutro Abs 15.4 (H) 1.7 - 7.7 K/uL   Lymphocytes Relative 10 %   Lymphs Abs 1.8 0.7 - 4.0 K/uL   Monocytes Relative 9 %   Monocytes Absolute 1.6 (H) 0.1 - 1.0 K/uL   Eosinophils Relative 0 %   Eosinophils Absolute 0.0 0.0 - 0.7 K/uL   Basophils Relative 0 %   Basophils Absolute 0.0 0.0 - 0.1 K/uL  Basic metabolic panel     Status: Abnormal   Collection Time: 01/30/17  4:55 PM  Result Value Ref Range   Sodium 139 135 - 145 mmol/L   Potassium 3.3 (L) 3.5 - 5.1 mmol/L   Chloride 103 101 - 111 mmol/L   CO2 30 22 - 32 mmol/L   Glucose, Bld 83 65 - 99 mg/dL   BUN 7 6 - 20 mg/dL    Creatinine, Ser 0.85 0.61 - 1.24 mg/dL   Calcium 8.6 (L) 8.9 - 10.3 mg/dL   GFR calc non Af Amer >60 >60 mL/min   GFR calc Af Amer >60 >60 mL/min    Comment: (NOTE) The eGFR has been calculated using the CKD EPI equation. This calculation has not been validated in all clinical situations. eGFR's persistently <60 mL/min signify possible Chronic Kidney Disease.    Anion gap 6 5 - 15     RADIOLOGY RESULTS: Ct Abdomen Pelvis W Contrast  Result Date: 01/30/2017 CLINICAL DATA:  21 y/o  M; rectal pain.  History of Crohn's disease. EXAM: CT ABDOMEN AND PELVIS WITH CONTRAST TECHNIQUE: Multidetector CT imaging of the abdomen and pelvis was performed using the standard protocol following bolus administration of intravenous contrast. CONTRAST:  180m ISOVUE-300 IOPAMIDOL (ISOVUE-300) INJECTION 61%, 1 ISOVUE-300 IOPAMIDOL (ISOVUE-300) INJECTION 61% COMPARISON:  12/28/2016 CT abdomen and pelvis. FINDINGS: Lower chest: No acute abnormality. Hepatobiliary: No focal liver abnormality is seen. No gallstones, gallbladder wall thickening, or biliary dilatation. Pancreas: Unremarkable. No pancreatic ductal dilatation or surrounding inflammatory changes. Spleen: Normal in size without focal abnormality. Adrenals/Urinary Tract: Adrenal glands are unremarkable. Kidneys are normal, without renal calculi, focal lesion, or hydronephrosis. Bladder is unremarkable. Stomach/Bowel: Stomach is within normal limits. Appendix appears normal. No evidence of bowel wall thickening, distention, or inflammatory changes. Vascular/Lymphatic: No significant vascular findings are present. No enlarged abdominal or pelvic lymph nodes. Reproductive: Uterus and bilateral adnexa are unremarkable. Other: Perianal fluid collection measuring up to 51 x 53 x 51 mm (AP x ML x CC series 2, image 85 and 81, series 4, image 85). The collection demonstrates peripheral enhancement and extends from approximately 2:00 to 9:00 wrapping around the left  lateral, posterior, and right lateral aspects of the anus (series 2, image 85). No intraperitoneal extension is identified. Musculoskeletal: No acute or significant osseous findings. IMPRESSION: Perianal fluid collection likely representing an abscess measuring up to 53 mm wrapping around the left lateral, posterior, and right lateral aspects of the anus. Electronically Signed   By: LKristine GarbeM.D.   On: 01/30/2017 16:24    Problem List: There are no active problems to display for this patient.   Assessment & Plan: Horseshoe abscess -perirectal-to incision and drainage    Matt B. MHassell Done MD, FSt. Louis Psychiatric Rehabilitation CenterSurgery, P.[facility] [phone] [phone] 01/30/2017 6:39 PM

## 2017-01-30 NOTE — ED Provider Notes (Signed)
[facility]-EMERGENCY DEPT Provider Note   CSN: [id number] Arrival date & time: 01/30/17  1116   By signing my name below, I, Clovis Pu, attest that this documentation has been prepared under the direction and in the presence of  Demetrios Loll, PA-C. Electronically Signed: Clovis Pu, ED Scribe. 01/30/17. 11:55 AM.   History   Chief Complaint Chief Complaint  Patient presents with  . Hemorrhoids   The history is provided by the patient. No language interpreter was used.   HPI Comments:  Jared Marshall is a 21 y.o. male, with a hx of migraines and Crohn's disease, who presents to the Emergency Department complaining of moderate rectal pain onset 4-5 days. His pain is worse with sitting. Pt also reports constipation, hematochezia, a headache onset yesterday night, photophobia, nausea and a hx of hemorrhoids. Describes the headache as unilateral left side and throbbing in nature. Pt has an appointment with his GI doctor tomorrow. He has taken Tylenol with no significant relief. He also reports daily 30 mg prednisone use. Pt denies fevers, vomiting, blurry vision, neck stiffness. or any other associated symptoms.    Past Medical History:  Diagnosis Date  . Abnormal colonoscopy   . Asthma   . Crohn's disease (HCC)     There are no active problems to display for this patient.   History reviewed. No pertinent surgical history.   Home Medications    Prior to Admission medications   Medication Sig Start Date End Date Taking? Authorizing Provider  ciprofloxacin (CIPRO) 500 MG tablet Take 1 tablet (500 mg total) by mouth 2 (two) times daily. One po bid x 7 days 12/28/16   Tomasita Crumble, MD  dicyclomine (BENTYL) 10 MG capsule Take 10 mg by mouth daily.    Historical Provider, MD  metroNIDAZOLE (FLAGYL) 500 MG tablet Take 1 tablet (500 mg total) by mouth 3 (three) times daily. One po bid x 7 days 12/28/16   Tomasita Crumble, MD  predniSONE (DELTASONE) 20 MG tablet Take 20 mg by mouth 2 (two) times  daily with a meal.    Historical Provider, MD    Family History No family history on file.  Social History Social History  Substance Use Topics  . Smoking status: Never Smoker  . Smokeless tobacco: Never Used  . Alcohol use No     Allergies   Patient has no known allergies.   Review of Systems Review of Systems  Constitutional: Negative for fever.  Eyes: Positive for photophobia. Negative for visual disturbance.  Gastrointestinal: Positive for blood in stool, constipation, nausea and rectal pain. Negative for vomiting.  All other systems reviewed and are negative.  Physical Exam Updated Vital Signs BP 139/85 (BP Location: Right Arm)   Pulse 98   Temp 98.2 F (36.8 C)   Resp 16   Ht 5\' 10"  (1.778 m)   Wt 160 lb (72.6 kg)   SpO2 99%   BMI 22.96 kg/m   Physical Exam  Constitutional: He is oriented to person, place, and time. He appears well-developed and well-nourished. No distress.  Patient is nontoxic appearing. He is playing on a cell phone in no acute distress.  HENT:  Head: Normocephalic and atraumatic.  Right Ear: Tympanic membrane, external ear and ear canal normal.  Left Ear: Tympanic membrane, external ear and ear canal normal.  Nose: Nose normal.  Mouth/Throat: Oropharynx is clear and moist.  Eyes: Conjunctivae and EOM are normal. Pupils are equal, round, and reactive to light.  Neck: Normal range of motion.  Neck supple.  No nuchal rigidity  Cardiovascular: Normal rate.   Pulmonary/Chest: Effort normal.  Abdominal: Soft. Bowel sounds are normal. He exhibits no distension. There is no tenderness. There is no rebound and no guarding.  Genitourinary: Rectal exam shows tenderness. Rectal exam shows no external hemorrhoid, no internal hemorrhoid, no fissure, no mass, anal tone normal and guaiac negative stool.  Genitourinary Comments: Patient with significant tenderness to palpation of the external rectum. No external hemorrhoids noted. Patient with  significant tenderness on rectal exam no internal hemorrhoids noted. Do not appreciate any fluctuant mass. There is no erythema of the rectum.no stool noted in the rectal vault. No gross hematochezia or melena noted.   Musculoskeletal: Normal range of motion.  Neurological: He is alert and oriented to person, place, and time.  The patient is alert, attentive, and oriented x 3. Speech is clear. Cranial nerve II-VII grossly intact. Negative pronator drift. Sensation intact. Strength 5/5 in all extremities. Reflexes 2+ and symmetric at biceps, triceps, knees, and ankles. Rapid alternating movement and fine finger movements intact. Romberg is absent. Posture and gait normal.   Skin: Skin is warm and dry. Capillary refill takes less than 2 seconds.  Psychiatric: He has a normal mood and affect.  Nursing note and vitals reviewed.  ED Treatments / Results  DIAGNOSTIC STUDIES:  Oxygen Saturation is 99% on RA, normal by my interpretation.    COORDINATION OF CARE:  11:53 AM Discussed treatment plan with pt at bedside and pt agreed to plan.  Labs (all labs ordered are listed, but only abnormal results are displayed) Labs Reviewed  CBC WITH DIFFERENTIAL/PLATELET - Abnormal; Notable for the following:       Result Value   WBC 18.9 (*)    RBC 3.70 (*)    Hemoglobin 8.8 (*)    HCT 28.5 (*)    MCV 77.0 (*)    MCH 23.8 (*)    Neutro Abs 15.4 (*)    Monocytes Absolute 1.6 (*)    All other components within normal limits  BASIC METABOLIC PANEL - Abnormal; Notable for the following:    Potassium 3.3 (*)    Calcium 8.6 (*)    All other components within normal limits    EKG  EKG Interpretation None       Radiology Ct Abdomen Pelvis W Contrast  Result Date: 01/30/2017 CLINICAL DATA:  21 y/o  M; rectal pain.  History of Crohn's disease. EXAM: CT ABDOMEN AND PELVIS WITH CONTRAST TECHNIQUE: Multidetector CT imaging of the abdomen and pelvis was performed using the standard protocol following  bolus administration of intravenous contrast. CONTRAST:  ISOVUE-300 IOPAMIDOL (ISOVUE-300) INJECTION 61%, 1 ISOVUE-300 IOPAMIDOL (ISOVUE-300) INJECTION 61% COMPARISON:  12/28/2016 CT abdomen and pelvis. FINDINGS: Lower chest: No acute abnormality. Hepatobiliary: No focal liver abnormality is seen. No gallstones, gallbladder wall thickening, or biliary dilatation. Pancreas: Unremarkable. No pancreatic ductal dilatation or surrounding inflammatory changes. Spleen: Normal in size without focal abnormality. Adrenals/Urinary Tract: Adrenal glands are unremarkable. Kidneys are normal, without renal calculi, focal lesion, or hydronephrosis. Bladder is unremarkable. Stomach/Bowel: Stomach is within normal limits. Appendix appears normal. No evidence of bowel wall thickening, distention, or inflammatory changes. Vascular/Lymphatic: No significant vascular findings are present. No enlarged abdominal or pelvic lymph nodes. Reproductive: Uterus and bilateral adnexa are unremarkable. Other: Perianal fluid collection measuring up to 51 x 53 x 51 mm (AP x ML x CC series 2, image 85 and 81, series 4, image 85). The collection demonstrates peripheral enhancement and  extends from approximately 2:00 to 9:00 wrapping around the left lateral, posterior, and right lateral aspects of the anus (series 2, image 85). No intraperitoneal extension is identified. Musculoskeletal: No acute or significant osseous findings. IMPRESSION: Perianal fluid collection likely representing an abscess measuring up to 53 mm wrapping around the left lateral, posterior, and right lateral aspects of the anus. Electronically Signed   By: Mitzi Hansen M.D.   On: 01/30/2017 16:24    Procedures Procedures (including critical care time)  Medications Ordered in ED Medications  iopamidol (ISOVUE-300) 61 % injection (not administered)  iopamidol (ISOVUE-300) 61 % injection (not administered)  sodium chloride 0.9 % injection (not  administered)  diphenhydrAMINE (BENADRYL) capsule 25 mg (25 mg Oral Given 01/30/17 1226)  prochlorperazine (COMPAZINE) tablet 5 mg (5 mg Oral Given 01/30/17 1236)  acetaminophen (TYLENOL) tablet 500 mg (500 mg Oral Given 01/30/17 1226)  sodium chloride 0.9 % bolus 1,000 mL (0 mLs Intravenous Stopped 01/30/17 1412)  iopamidol (ISOVUE-300) 61 % injection 30 mL (30 mLs Oral Contrast Given 01/30/17 1547)  oxyCODONE (Oxy IR/ROXICODONE) immediate release tablet 5 mg (5 mg Oral Given 01/30/17 1428)  iopamidol (ISOVUE-300) 61 % injection 100 mL (100 mLs Intravenous Contrast Given 01/30/17 1547)     Initial Impression / Assessment and Plan / ED Course  I have reviewed the triage vital signs and the nursing notes.  Pertinent labs & imaging results that were available during my care of the patient were reviewed by me and considered in my medical decision making (see chart for details).  Clinical Course as of Jan 30 1827  Wed Jan 30, 2017  1812 CT Abdomen Pelvis W Contrast [AP]  4098 CT Abdomen Pelvis W Contrast [TM]    Clinical Course User Index [AP] Clovis Pu [TM] Audry Pili, PA-C    Patient presents to the ED today with complaints of rectal pain. He was diagnosed with Crohn's one month ago. Has been on 30 mg prednisone since then. Supposed to follow up with GI tomorrow. Patient also endorses unilateral headache with photophobia and nausea. Denies thunderclap headache. No nuchal rigidity. No focal neuro deficit. Likely a migraine. Patient cannot have Toradol due to Crohn's. We'll treat with oral Compazine, Benadryl, pain medicine, fluids. Patient's headache significantly improved. Patient is afebrile. Exam is not concerning for [facility], ICH, meningitis, or temporal arteritis. Rectal exam shows no internal or external hemorrhoids. Significant tenderness to palpation. Given patient's new diagnosis of Crohn's will order CT abdomen and pelvis. Patient denies any sex with men. CT abdomen pelvis reveals a 5 cm  perianal abscess. Spoke with Dr. Randa Evens with GI who will follow patient in the hospital. He recommends consulting general surgery for possible surgical intervention. Spoke with Dr. Daphine Deutscher with general surgery who will take patient to the OR for surgical intervention. Labs revealed a leukocytosis of 18. Patient is on prednisone daily. He is afebrile the ED. He is nontoxic appearing. Potassium is slightly decreased at 3.3. Will likely need oral potassium and outpatient follow-up. Pain was controlled in the ED. Patient is hemodynamically stable. He is no acute distress. Patient transported to surgery.  .    Final Clinical Impressions(s) / ED Diagnoses   Final diagnoses:  Perianal abscess    New Prescriptions New Prescriptions   No medications on file  I personally performed the services described in this documentation, which was scribed in my presence. The recorded information has been reviewed and is accurate.     Rise Mu, PA-C 01/30/17 1828  Courteney Randall An, MD 02/02/17 1929

## 2017-01-31 ENCOUNTER — Encounter (HOSPITAL_COMMUNITY): Payer: Self-pay | Admitting: Surgery

## 2017-01-31 DIAGNOSIS — K5090 Crohn's disease, unspecified, without complications: Secondary | ICD-10-CM | POA: Diagnosis present

## 2017-01-31 DIAGNOSIS — D649 Anemia, unspecified: Secondary | ICD-10-CM | POA: Diagnosis present

## 2017-01-31 DIAGNOSIS — K610 Anal abscess: Secondary | ICD-10-CM | POA: Diagnosis present

## 2017-01-31 DIAGNOSIS — Z7952 Long term (current) use of systemic steroids: Secondary | ICD-10-CM | POA: Diagnosis not present

## 2017-01-31 DIAGNOSIS — K612 Anorectal abscess: Secondary | ICD-10-CM | POA: Diagnosis present

## 2017-01-31 DIAGNOSIS — K5900 Constipation, unspecified: Secondary | ICD-10-CM | POA: Diagnosis present

## 2017-01-31 DIAGNOSIS — Z79899 Other long term (current) drug therapy: Secondary | ICD-10-CM | POA: Diagnosis not present

## 2017-01-31 LAB — BASIC METABOLIC PANEL
ANION GAP: 7 (ref 5–15)
BUN: 9 mg/dL (ref 6–20)
CO2: 30 mmol/L (ref 22–32)
Calcium: 9.1 mg/dL (ref 8.9–10.3)
Chloride: 102 mmol/L (ref 101–111)
Creatinine, Ser: 0.97 mg/dL (ref 0.61–1.24)
Glucose, Bld: 93 mg/dL (ref 65–99)
POTASSIUM: 3.5 mmol/L (ref 3.5–5.1)
Sodium: 139 mmol/L (ref 135–145)

## 2017-01-31 LAB — CBC
HEMATOCRIT: 26.3 % — AB (ref 39.0–52.0)
Hemoglobin: 8 g/dL — ABNORMAL LOW (ref 13.0–17.0)
MCH: 22.9 pg — ABNORMAL LOW (ref 26.0–34.0)
MCHC: 30.4 g/dL (ref 30.0–36.0)
MCV: 75.1 fL — ABNORMAL LOW (ref 78.0–100.0)
PLATELETS: 343 10*3/uL (ref 150–400)
RBC: 3.5 MIL/uL — AB (ref 4.22–5.81)
RDW: 14.4 % (ref 11.5–15.5)
WBC: 21.9 10*3/uL — AB (ref 4.0–10.5)

## 2017-01-31 MED ORDER — HEPARIN SODIUM (PORCINE) 5000 UNIT/ML IJ SOLN
5000.0000 [IU] | Freq: Three times a day (TID) | INTRAMUSCULAR | Status: DC
  Administered 2017-01-31 – 2017-02-02 (×5): 5000 [IU] via SUBCUTANEOUS
  Filled 2017-01-31 (×6): qty 1

## 2017-01-31 MED ORDER — MESALAMINE 400 MG PO CPDR
800.0000 mg | DELAYED_RELEASE_CAPSULE | Freq: Three times a day (TID) | ORAL | Status: DC
  Administered 2017-01-31 – 2017-02-02 (×7): 800 mg via ORAL
  Filled 2017-01-31 (×8): qty 2

## 2017-01-31 MED ORDER — PREDNISONE 20 MG PO TABS
20.0000 mg | ORAL_TABLET | Freq: Every morning | ORAL | Status: DC
  Administered 2017-02-01 – 2017-02-02 (×2): 20 mg via ORAL
  Filled 2017-01-31 (×2): qty 1

## 2017-01-31 MED ORDER — OXYCODONE-ACETAMINOPHEN 5-325 MG PO TABS
1.0000 | ORAL_TABLET | ORAL | Status: DC | PRN
  Administered 2017-02-02 (×2): 2 via ORAL
  Filled 2017-01-31 (×2): qty 2

## 2017-01-31 MED ORDER — ACETAMINOPHEN 325 MG PO TABS
650.0000 mg | ORAL_TABLET | Freq: Four times a day (QID) | ORAL | Status: DC | PRN
  Administered 2017-01-31 – 2017-02-01 (×4): 650 mg via ORAL
  Filled 2017-01-31 (×3): qty 2

## 2017-01-31 MED ORDER — ONDANSETRON 4 MG PO TBDP
4.0000 mg | ORAL_TABLET | Freq: Once | ORAL | Status: AC
  Administered 2017-01-31: 4 mg via ORAL
  Filled 2017-01-31: qty 1

## 2017-01-31 NOTE — Progress Notes (Signed)
Patient with perirectal abcess Post op I&D of Perirectal abcess.  Temp was elevated 102.2.  He had 12 blankets on during temp check. And also was not using IS regularly.  Also has WC 18.8.   Temp fluctuate from 101.1 to 98.4.  With in min.  Most of blankets removed and was encouraged to use IS.  Temp decreased with this.  Will continue to monitor and recheck and call MD if it stays the same.

## 2017-01-31 NOTE — Progress Notes (Signed)
1 Day Post-Op  Subjective: Says he feels better. He has about a 1-2 cm incision on the right.. He has half-inch iodoform packing. No real erythema. He is tender on the right side but not on the left. We removed about 8 inches of packing but did not remove more because I do not think I can repack this.  Objective: Vital signs in last 24 hours: Temp:  [98.1 F (36.7 C)-102.8 F (39.3 C)] 99.7 F (37.6 C) (02/15 1054) Pulse Rate:  [88-121] 111 (02/15 1003) Resp:  [14-25] 18 (02/15 1003) BP: (100-139)/(47-85) 115/70 (02/15 1003) SpO2:  [95 %-100 %] 98 % (02/15 1003) Weight:  [72.6 kg (160 lb)] 72.6 kg (160 lb) (02/14 1120)  1300 IV 1150 urine TM 101 at 1 AM, now down to 99.7 VSS BMP OK, WBC is up, H/H is low  Intake/Output from previous day: 02/14 0701 - 02/15 0700 In: 2299 [I.V.:1300; IV Piggyback:999] Out: 1160 [Urine:1150; Blood:10] Intake/Output this shift: Total I/O In: 240 [P.O.:240] Out: 700 [Urine:700]  General appearance: alert, cooperative and no distress Skin: open site is sore but better.  incision is about 1.5-2 cm long.  I pulled about 8 inches of packing but left the remainder.  I  do not think we can repack this.    Lab Results:   Recent Labs  01/30/17 1655 01/31/17 0427  WBC 18.9* 21.9*  HGB 8.8* 8.0*  HCT 28.5* 26.3*  PLT 361 343    BMET  Recent Labs  01/30/17 1655 01/31/17 0427  NA 139 139  K 3.3* 3.5  CL 103 102  CO2 30 30  GLUCOSE 83 93  BUN 7 9  CREATININE 0.85 0.97  CALCIUM 8.6* 9.1   PT/INR No results for input(s): LABPROT, INR in the last 72 hours.  No results for input(s): AST, ALT, ALKPHOS, BILITOT, PROT, ALBUMIN in the last 168 hours.   Lipase     Component Value Date/Time   LIPASE 26 12/27/2016 1720     Studies/Results: Ct Abdomen Pelvis W Contrast  Result Date: 01/30/2017 CLINICAL DATA:  21 y/o  M; rectal pain.  History of Crohn's disease. EXAM: CT ABDOMEN AND PELVIS WITH CONTRAST TECHNIQUE: Multidetector CT imaging  of the abdomen and pelvis was performed using the standard protocol following bolus administration of intravenous contrast. CONTRAST:  ISOVUE-300 IOPAMIDOL (ISOVUE-300) INJECTION 61%, 1 ISOVUE-300 IOPAMIDOL (ISOVUE-300) INJECTION 61% COMPARISON:  12/28/2016 CT abdomen and pelvis. FINDINGS: Lower chest: No acute abnormality. Hepatobiliary: No focal liver abnormality is seen. No gallstones, gallbladder wall thickening, or biliary dilatation. Pancreas: Unremarkable. No pancreatic ductal dilatation or surrounding inflammatory changes. Spleen: Normal in size without focal abnormality. Adrenals/Urinary Tract: Adrenal glands are unremarkable. Kidneys are normal, without renal calculi, focal lesion, or hydronephrosis. Bladder is unremarkable. Stomach/Bowel: Stomach is within normal limits. Appendix appears normal. No evidence of bowel wall thickening, distention, or inflammatory changes. Vascular/Lymphatic: No significant vascular findings are present. No enlarged abdominal or pelvic lymph nodes. Reproductive: Uterus and bilateral adnexa are unremarkable. Other: Perianal fluid collection measuring up to 51 x 53 x 51 mm (AP x ML x CC series 2, image 85 and 81, series 4, image 85). The collection demonstrates peripheral enhancement and extends from approximately 2:00 to 9:00 wrapping around the left lateral, posterior, and right lateral aspects of the anus (series 2, image 85). No intraperitoneal extension is identified. Musculoskeletal: No acute or significant osseous findings. IMPRESSION: Perianal fluid collection likely representing an abscess measuring up to 53 mm wrapping around the left  lateral, posterior, and right lateral aspects of the anus. Electronically Signed   By: Mitzi Hansen M.D.   On: 01/30/2017 16:24   Prior to Admission medications   Medication Sig Start Date End Date Taking? Authorizing Provider  balsalazide (COLAZAL) 750 MG capsule Take by mouth as directed. Take 5 capsules in am and  4 Capsules at night 01/24/17  Yes Historical Provider, MD  predniSONE (DELTASONE) 10 MG tablet Take 30 mg by mouth daily with breakfast.   Yes Historical Provider, MD    Medications: Marland Kitchen Mesalamine  800 mg Oral TID  . piperacillin-tazobactam (ZOSYN)  IV  3.375 g Intravenous Q8H  . [START ON 02/01/2017] predniSONE  20 mg Oral q morning - 10a     Assessment/Plan  [place] Perirectal abscess I&D of Horseshoe perirectal abscess 01/30/17 Dr. Luretha Murphy   POD1 Crohn's disease Anemia -  H/H 8/26.3  FEN: Regular diet ID: Zosyn 01/30/17 =>> day 2 DVT:  SCD    Plan:  Antibiotics, Sitz bath, mobilize.  Saline lock IV.  Recheck labs in AM. Add  Heparin for DVT prophylaxis.    LOS: 0 days    Anasha Perfecto 01/31/2017 [phone]

## 2017-01-31 NOTE — Progress Notes (Signed)
Patient reported having bowel movement with blood on it,stating it might be because of his Crohn's and admit having the same issue before. Perirectal incision was assessed ,serosanguineous drainage and no active bleeding. We will continue to monitor and assess.

## 2017-01-31 NOTE — Consult Note (Addendum)
[facility] CONSULT Reason for consult: new onset of Crohn's disease Referring Physician: emergency room  Jared Marshall is an 21 y.o. male.  HPI: he was diagnosed with Crohn's disease in January after presenting to the emergency room and was sent home on prednisone. Outpatient appointment with gastroenterologist was made but had not yet occurred. He began to have severe anal pain presented back to the emergency room and was found to have a large perirectal abscess. This was operated on by Dr. Hassell Done last night. Were asked to see him to follow up on his Crohn's disease. He was being treated with balsalazide 750 mg twice daily and prednisone 30 mg daily prior to his readmission here last night. Repeat CT last night showed a 5 cm horseshoe type perianal abscess. CT a month ago showed mild thickening of the: without much in the way of small bowel disease. The general plan was to have him CGI to perform a colonoscopy to obtain a tissue diagnosis of IBD. Notably, there did not seem to be any obvious small bowel thickening in the terminal ileum  Past Medical History:  Diagnosis Date  . Abnormal colonoscopy   . Asthma   . Crohn's disease ([place])     History reviewed. No pertinent surgical history.  History reviewed. No pertinent family history.  Social History:  reports that he has never smoked. He has never used smokeless tobacco. He reports that he does not drink alcohol or use drugs.  Allergies: No Known Allergies  Medications; Prior to Admission medications   Medication Sig Start Date End Date Taking? Authorizing Provider  balsalazide (COLAZAL) 750 MG capsule Take by mouth as directed. Take 5 capsules in am and 4 Capsules at night 01/24/17  Yes Historical Provider, MD  predniSONE (DELTASONE) 10 MG tablet Take 30 mg by mouth daily with breakfast.   Yes Historical Provider, MD   . piperacillin-tazobactam (ZOSYN)  IV  3.375 g Intravenous Q8H  . predniSONE  20 mg Oral BID WC   PRN  Meds acetaminophen, morphine injection Results for orders placed or performed during the hospital encounter of 01/30/17 (from the past 48 hour(s))  CBC with Differential     Status: Abnormal   Collection Time: 01/30/17  4:55 PM  Result Value Ref Range   WBC 18.9 (H) 4.0 - 10.5 K/uL   RBC 3.70 (Marshall) 4.22 - 5.81 MIL/uL   Hemoglobin 8.8 (Marshall) 13.0 - 17.0 g/dL   HCT 28.5 (Marshall) 39.0 - 52.0 %   MCV 77.0 (Marshall) 78.0 - 100.0 fL   MCH 23.8 (Marshall) 26.0 - 34.0 pg   MCHC 30.9 30.0 - 36.0 g/dL   RDW 14.5 11.5 - 15.5 %   Platelets 361 150 - 400 K/uL   Neutrophils Relative % 81 %   Neutro Abs 15.4 (H) 1.7 - 7.7 K/uL   Lymphocytes Relative 10 %   Lymphs Abs 1.8 0.7 - 4.0 K/uL   Monocytes Relative 9 %   Monocytes Absolute 1.6 (H) 0.1 - 1.0 K/uL   Eosinophils Relative 0 %   Eosinophils Absolute 0.0 0.0 - 0.7 K/uL   Basophils Relative 0 %   Basophils Absolute 0.0 0.0 - 0.1 K/uL  Basic metabolic panel     Status: Abnormal   Collection Time: 01/30/17  4:55 PM  Result Value Ref Range   Sodium 139 135 - 145 mmol/Marshall   Potassium 3.3 (Marshall) 3.5 - 5.1 mmol/Marshall   Chloride 103 101 - 111 mmol/Marshall   CO2 30 22 - 32  mmol/Marshall   Glucose, Bld 83 65 - 99 mg/dL   BUN 7 6 - 20 mg/dL   Creatinine, Ser 0.85 0.61 - 1.24 mg/dL   Calcium 8.6 (Marshall) 8.9 - 10.3 mg/dL   GFR calc non Af Amer >60 >60 mL/min   GFR calc Af Amer >60 >60 mL/min    Comment: (NOTE) The eGFR has been calculated using the CKD EPI equation. This calculation has not been validated in all clinical situations. eGFR's persistently <60 mL/min signify possible Chronic Kidney Disease.    Anion gap 6 5 - 15  Aerobic/Anaerobic Culture (surgical/deep wound)     Status: None (Preliminary result)   Collection Time: 01/30/17  7:26 PM  Result Value Ref Range   Specimen Description ABSCESS DEEP PERIRECTAL    Gram Stain      MODERATE WBC PRESENT, PREDOMINANTLY PMN ABUNDANT GRAM POSITIVE COCCI IN CHAINS MODERATE GRAM NEGATIVE RODS Performed at [facility], [place]  [address], [place], [place] [address]    Culture PENDING    Report Status PENDING   CBC     Status: Abnormal   Collection Time: 01/31/17  4:27 AM  Result Value Ref Range   WBC 21.9 (H) 4.0 - 10.5 K/uL   RBC 3.50 (Marshall) 4.22 - 5.81 MIL/uL   Hemoglobin 8.0 (Marshall) 13.0 - 17.0 g/dL   HCT 26.3 (Marshall) 39.0 - 52.0 %   MCV 75.1 (Marshall) 78.0 - 100.0 fL   MCH 22.9 (Marshall) 26.0 - 34.0 pg   MCHC 30.4 30.0 - 36.0 g/dL   RDW 14.4 11.5 - 15.5 %   Platelets 343 150 - 400 K/uL  Basic metabolic panel     Status: None   Collection Time: 01/31/17  4:27 AM  Result Value Ref Range   Sodium 139 135 - 145 mmol/Marshall   Potassium 3.5 3.5 - 5.1 mmol/Marshall   Chloride 102 101 - 111 mmol/Marshall   CO2 30 22 - 32 mmol/Marshall   Glucose, Bld 93 65 - 99 mg/dL   BUN 9 6 - 20 mg/dL   Creatinine, Ser 0.97 0.61 - 1.24 mg/dL   Calcium 9.1 8.9 - 10.3 mg/dL   GFR calc non Af Amer >60 >60 mL/min   GFR calc Af Amer >60 >60 mL/min    Comment: (NOTE) The eGFR has been calculated using the CKD EPI equation. This calculation has not been validated in all clinical situations. eGFR's persistently <60 mL/min signify possible Chronic Kidney Disease.    Anion gap 7 5 - 15    Ct Abdomen Pelvis W Contrast  Result Date: 01/30/2017 CLINICAL DATA:  21 y/o  M; rectal pain.  History of Crohn's disease. EXAM: CT ABDOMEN AND PELVIS WITH CONTRAST TECHNIQUE: Multidetector CT imaging of the abdomen and pelvis was performed using the standard protocol following bolus administration of intravenous contrast. CONTRAST:  137m ISOVUE-300 IOPAMIDOL (ISOVUE-300) INJECTION 61%, 1 ISOVUE-300 IOPAMIDOL (ISOVUE-300) INJECTION 61% COMPARISON:  12/28/2016 CT abdomen and pelvis. FINDINGS: Lower chest: No acute abnormality. Hepatobiliary: No focal liver abnormality is seen. No gallstones, gallbladder wall thickening, or biliary dilatation. Pancreas: Unremarkable. No pancreatic ductal dilatation or surrounding inflammatory changes. Spleen: Normal in size without focal abnormality. Adrenals/Urinary  Tract: Adrenal glands are unremarkable. Kidneys are normal, without renal calculi, focal lesion, or hydronephrosis. Bladder is unremarkable. Stomach/Bowel: Stomach is within normal limits. Appendix appears normal. No evidence of bowel wall thickening, distention, or inflammatory changes. Vascular/Lymphatic: No significant vascular findings are present. No enlarged abdominal or pelvic lymph nodes. Reproductive: Uterus  and bilateral adnexa are unremarkable. Other: Perianal fluid collection measuring up to 51 x 53 x 51 mm (AP x ML x CC series 2, image 85 and 81, series 4, image 85). The collection demonstrates peripheral enhancement and extends from approximately 2:00 to 9:00 wrapping around the left lateral, posterior, and right lateral aspects of the anus (series 2, image 85). No intraperitoneal extension is identified. Musculoskeletal: No acute or significant osseous findings. IMPRESSION: Perianal fluid collection likely representing an abscess measuring up to 53 mm wrapping around the left lateral, posterior, and right lateral aspects of the anus. Electronically Signed   By: Kristine Garbe M.D.   On: 01/30/2017 16:24               Blood pressure 113/67, pulse 95, temperature 100.2 F (37.9 C), temperature source Oral, resp. rate 16, height 5' 10"  (1.778 m), weight 72.6 kg (160 lb), SpO2 100 %.  Physical exam:  any idea what General-- pleasant young African-American male no distress ENT-- nonicteric  Neck-- full range of motion Heart-- normal S1 and S2 Lungs-- clear Abdomen-- soft and nontender Psych-- alert and oriented appropriate   Assessment: 1. Possible IBD. This was suggested by signs of colitis on CT scan a month ago that do not seem to be present so much now after treatment with balsalazide and steroids. This certainly could be IBD and the presence of perirectal abscess does suggest Crohn's but at this point we do not have a clear tissue diagnosis. It's also possible  this could be colitis from some other costs. 2. Large perirectal abscess drained  Plan: 1. I have discussed with patient. He will need further diagnostic testing in the future after his rectal area his heels sufficiently. He has been on balsalazide which I would continue at this time and I would try to taper him from the prednisone until a diagnosis is clear. This should also help him recover from his perirectal abscess. I have left him my card and ask him to come and see me in the office after discharge. We will begin prednisone taper now and start with 20 mg a day. Likely taper furthers an outpatient. We'll give delzicol in the hospital since balsalazide not available  Jared Marshall,Jared Marshall 01/31/2017, 9:00 AM   This note was created using voice recognition software and minor errors may Have occurred unintentionally. Pager: [phone] If no answer or after hours call [phone]

## 2017-02-01 LAB — CBC
HCT: 24.3 % — ABNORMAL LOW (ref 39.0–52.0)
HEMOGLOBIN: 7.6 g/dL — AB (ref 13.0–17.0)
MCH: 23.3 pg — ABNORMAL LOW (ref 26.0–34.0)
MCHC: 31.3 g/dL (ref 30.0–36.0)
MCV: 74.5 fL — ABNORMAL LOW (ref 78.0–100.0)
PLATELETS: 315 10*3/uL (ref 150–400)
RBC: 3.26 MIL/uL — AB (ref 4.22–5.81)
RDW: 14.5 % (ref 11.5–15.5)
WBC: 16.3 10*3/uL — AB (ref 4.0–10.5)

## 2017-02-01 LAB — AEROBIC/ANAEROBIC CULTURE (SURGICAL/DEEP WOUND)

## 2017-02-01 LAB — BASIC METABOLIC PANEL
Anion gap: 6 (ref 5–15)
BUN: 8 mg/dL (ref 6–20)
CHLORIDE: 103 mmol/L (ref 101–111)
CO2: 33 mmol/L — ABNORMAL HIGH (ref 22–32)
Calcium: 8.6 mg/dL — ABNORMAL LOW (ref 8.9–10.3)
Creatinine, Ser: 0.93 mg/dL (ref 0.61–1.24)
Glucose, Bld: 102 mg/dL — ABNORMAL HIGH (ref 65–99)
POTASSIUM: 3 mmol/L — AB (ref 3.5–5.1)
SODIUM: 142 mmol/L (ref 135–145)

## 2017-02-01 LAB — AEROBIC/ANAEROBIC CULTURE W GRAM STAIN (SURGICAL/DEEP WOUND)

## 2017-02-01 MED ORDER — OXYCODONE-ACETAMINOPHEN 5-325 MG PO TABS: 1.0000 | ORAL_TABLET | ORAL | 0 refills | Status: DC | PRN

## 2017-02-01 MED ORDER — METRONIDAZOLE 500 MG PO TABS: 500.0000 mg | ORAL_TABLET | Freq: Three times a day (TID) | ORAL | 0 refills | Status: AC

## 2017-02-01 MED ORDER — PHENOL 1.4 % MT LIQD
1.0000 | OROMUCOSAL | Status: DC | PRN
  Filled 2017-02-01: qty 177

## 2017-02-01 MED ORDER — CIPROFLOXACIN HCL 500 MG PO TABS: 500.0000 mg | ORAL_TABLET | Freq: Two times a day (BID) | ORAL | 0 refills | Status: AC

## 2017-02-01 MED ORDER — MENTHOL 3 MG MT LOZG
1.0000 | LOZENGE | OROMUCOSAL | Status: DC | PRN
  Filled 2017-02-01: qty 9

## 2017-02-01 NOTE — Discharge Instructions (Addendum)
Pull packing out 2 inches per day.    Warm water tub soaks 3-6 times a day.  Appointment with [facility] surgeon (Dr. Daphine Deutscher) in 1 week.  Call office [phone]) to make appointment.  Call office for any wound problems.   ANORECTAL SURGERY:  POST OPERATIVE INSTRUCTIONS  ######################################################################  EAT Gradually transition to a high fiber diet with a fiber supplement over the next few weeks after discharge.  Start with a pureed / full liquid diet (see below)  WALK Walk an hour a day.  Control your pain to do that.    CONTROL PAIN Control pain so that you can walk, sleep, tolerate sneezing/coughing, go up/down stairs.  HAVE A BOWEL MOVEMENT DAILY Keep your bowels regular to avoid problems.  OK to try a laxative to override constipation.  OK to use an antidairrheal to slow down diarrhea.  Call if not better after 2 tries  CALL IF YOU HAVE PROBLEMS/CONCERNS Call if you are still struggling despite following these instructions. Call if you have concerns not answered by these instructions  ######################################################################    1. Take your usually prescribed home medications unless otherwise directed. 2. DIET: Follow a light bland diet the first 24 hours after arrival home, such as soup, liquids, crackers, etc.  Be sure to include lots of fluids daily.  Avoid fast food or heavy meals as your are more likely to get nauseated.  Eat a low fat the next few days after surgery.   3. PAIN CONTROL: a. Pain is best controlled by a usual combination of three different methods TOGETHER: i. Ice/Heat ii. Over the counter pain medication iii. Prescription pain medication b. Most patients will experience some swelling and discomfort in the anus/rectal area. and incisions.  Ice packs or heat (30-60 minutes up to 6 times a day) will help. Use ice for the first few days to help decrease swelling and bruising, then switch to  heat such as warm towels, sitz baths, warm baths, etc to help relax tight/sore spots and speed recovery.  Some people prefer to use ice alone, heat alone, alternating between ice & heat.  Experiment to what works for you.  Swelling and bruising can take several weeks to resolve.   i. It is helpful to take an over-the-counter pain medication regularly for the first few weeks.  Choose acetaminophen (Tylenol, etc) 500-650mg  four times a day (every meal & bedtime) c. A  prescription for pain medication (such as oxycodone, hydrocodone, etc) should be given to you upon discharge.  Take your pain medication as prescribed.  i. If you are having problems/concerns with the prescription medicine (does not control pain, nausea, vomiting, rash, itching, etc), please call us [phone] to see if we need to switch you to a different pain medicine that will work better for you and/or control your side effect better. ii. If you need a refill on your pain medication, please contact your pharmacy.  They will contact our office to request authorization. Prescriptions will not be filled after 5 pm or on week-ends.  Use a Sitz Bath 4-8 times a day for relief   [employer] A sitz bath is a warm water bath taken in the sitting position that covers only the hips and buttocks. It may be used for either healing or hygiene purposes. Sitz baths are also used to relieve pain, itching, or muscle spasms. The water may contain medicine. Moist heat will help you heal and relax.  HOME CARE INSTRUCTIONS  Take 3 to 4  sitz baths a day. 1. Fill the bathtub half full with warm water. 2. Sit in the water and open the drain a little. 3. Turn on the warm water to keep the tub half full. Keep the water running constantly. 4. Soak in the water for 15 to 20 minutes. 5. After the sitz bath, pat the affected area dry first.   4. KEEP YOUR BOWELS REGULAR a. The goal is one bowel movement a day b. Avoid getting constipated.  Between the  surgery and the pain medications, it is common to experience some constipation.  Increasing fluid intake and taking a fiber supplement (such as Metamucil, Citrucel, FiberCon, MiraLax, etc) 1-2 times a day regularly will usually help prevent this problem from occurring.  A mild laxative (prune juice, Milk of Magnesia, MiraLax, etc) should be taken according to package directions if there are no bowel movements after 48 hours. c. Watch out for diarrhea.  If you have many loose bowel movements, simplify your diet to bland foods & liquids for a few days.  Stop any stool softeners and decrease your fiber supplement.  Switching to mild anti-diarrheal medications (Kayopectate, Pepto Bismol) can help.  If this worsens or does not improve, please call us.  5. Wound Care  a. Remove your bandages the day after surgery.  Unless discharge instructions indicate otherwise, leave your bandage dry and in place overnight.  Remove the bandage during your first bowel movement.   b. Wear an absorbent pad or soft cotton gauze in your underwear as needed to catch any drainage and help keep the area  c. Keep the area clean and dry.  Bathe / shower every day.  Keep the area clean by showering / bathing over the incision / wound.   It is okay to soak an open wound to help wash it.  Wet wipes or showers / gentle washing after bowel movements is often less traumatic than regular toilet paper. d. Bonita Quin will often notice bleeding with bowel movements.  This should slow down by the end of the first week of surgery e. Expect some drainage.  This should slow down, too, by the end of the first week of surgery.  Wear an absorbent pad or soft cotton gauze in your underwear until the drainage stops.  6. ACTIVITIES as tolerated:   a. You may resume regular (light) daily activities beginning the next day--such as daily self-care, walking, climbing stairs--gradually increasing activities as tolerated.  If you can walk 30 minutes without  difficulty, it is safe to try more intense activity such as jogging, treadmill, bicycling, low-impact aerobics, swimming, etc. b. Save the most intensive and strenuous activity for last such as sit-ups, heavy lifting, contact sports, etc  Refrain from any heavy lifting or straining until you are off narcotics for pain control.   c. DO NOT PUSH THROUGH PAIN.  Let pain be your guide: If it hurts to do something, don't do it.  Pain is your body warning you to avoid that activity for another week until the pain goes down. d. You may drive when you are no longer taking prescription pain medication, you can comfortably sit for long periods of time, and you can safely maneuver your car and apply brakes. e. Bonita Quin may have sexual intercourse when it is comfortable.  7. FOLLOW UP in our office a. Please call [facility] at [phone] to set up an appointment to see your surgeon in the office for a follow-up appointment approximately 1 week after your  surgery. b. Make sure that you call for this appointment the day you arrive home to insure a convenient appointment time. 10. IF YOU HAVE DISABILITY OR FAMILY LEAVE FORMS, BRING THEM TO THE OFFICE FOR PROCESSING.  DO NOT GIVE THEM TO YOUR DOCTOR.        WHEN TO CALL us [phone]: 1. Poor pain control 2. Reactions / problems with new medications (rash/itching, nausea, etc)  3. Fever over 101.5 F (38.5 C) 4. Inability to urinate 5. Nausea and/or vomiting 6. Worsening swelling or bruising 7. Continued bleeding from incision. 8. Increased pain, redness, or drainage from the incision  The clinic staff is available to answer your questions during regular business hours (8:30am-5pm).  Please dont hesitate to call and ask to speak to one of our nurses for clinical concerns.   A surgeon from [facility] is always on call at the hospitals   If you have a medical emergency, go to the nearest emergency room or call 911.    [facility] [address], [place], [place]  [address] ? MAIN: [phone] ? TOLL FREE: [phone] ? FAX [phone] [email or web address]  Crohn Disease Crohn disease is a long-lasting (chronic) disease that affects your gastrointestinal (GI) tract. It often causes irritation and swelling (inflammation) in your small intestine and the beginning of your large intestine. However, it can affect any part of your GI tract. Crohn disease is part of a group of illnesses that are known as inflammatory bowel disease (IBD). Crohn disease may start slowly and get worse over time. Symptoms may come and go. They may also disappear for months or even years at a time (remission). What are the causes? The exact cause of Crohn disease is not known. It may be a response that causes your body's defense system (immune system) to mistakenly attack healthy cells and tissues (autoimmune response). Your genes and your environment may also play a role. What increases the risk? You may be at greater risk for Crohn disease if you:  Have other family members with Crohn disease or another IBD.  Use any tobacco products, including cigarettes, chewing tobacco, or electronic cigarettes.  Are in your 49s.  Have [place] European ancestry. What are the signs or symptoms? The main signs and symptoms of Crohn disease involve your GI tract. These include:  Diarrhea.  Rectal bleeding.  An urgent need to move your bowels.  The feeling that you are not finished having a bowel movement.  Abdominal pain or cramping.  Constipation. General signs and symptoms of Crohn disease may also include:  Unexplained weight loss.  Fatigue.  Fever.  Nausea.  Loss of appetite.  Joint pain  Changes in vision.  Red bumps on your skin. How is this diagnosed? Your health care provider may suspect Crohn disease based on your symptoms and your medical history. Your health care provider will do a  physical exam. You may need to see a health care provider who specializes in diseases of the digestive tract (gastroenterologist). You may also have tests to help your health care providers make a diagnosis. These may include:  Blood tests.  Stool sample tests.  Imaging tests, such as X-rays and CT scans.  Tests to examine the inside of your intestines using a long, flexible tube that has a light and a camera on the end (endoscopy or colonoscopy).  A procedure to take tissue samples from inside your bowel (biopsy) to be examined under a microscope.  How is this treated? There is no cure for Crohn disease. Treatment will focus on managing your symptoms. Crohn disease affects each person differently. Your treatment may include:  Resting your bowels. Drinking only clear liquids or getting nutrition through an IV for a period of time gives your bowels a chance to heal because they are not passing stools.  Medicines. These may be used alone or in combination (combination therapy). These may include antibiotic medicines. You may be given medicines that help to:  Reduce inflammation.  Control your immune system activity.  Fight infections.  Relieve cramps and prevent diarrhea.  Control your pain.  Surgery. You may need surgery if:  Medicines and other treatments are no longer working.  You develop complications from severe Crohn disease.  A section of your intestine becomes so damaged that it needs to be removed. Follow these instructions at home:  Take medicines only as directed by your health care provider.  If you were prescribed an antibiotic medicine, finish it all even if you start to feel better.  Keep all follow-up visits as directed by your health care provider. This is important.  Talk with your health care provider about changing your diet. This may help your symptoms. Your health care provide may recommend changes, such as:  Drinking more fluids.  Avoiding milk and  other foods that contain lactose.  Eating a low-fat diet.  Avoiding high-fiber foods, such as popcorn and nuts.  Avoiding carbonated beverages, such as soda.  Eating smaller meals more often rather than eating large meals.  Keeping a food diary to identify foods that make your symptoms better or worse.  Do not use any tobacco products, including cigarettes, chewing tobacco, or electronic cigarettes. If you need help quitting, ask your health care provider.  Limit alcohol intake to no more than 1 drink per day for nonpregnant women and 2 drinks per day for men. One drink equals 12 ounces of beer, 5 ounces of wine, or 1 ounces of hard liquor.  Exercise daily or as directed by your health care provider. Contact a health care provider if:  You have diarrhea, abdominal cramps, and other gastrointestinal problems that are present almost all of the time.  Your symptoms do not improve with treatment.  You continue to lose weight.  You develop a rash or sores on your skin.  You develop eye problems.  You have a fever.  Your symptoms get worse.  You develop new symptoms. Get help right away if:  You have bloody diarrhea.  You develop severe abdominal pain.  You cannot pass stools. This information is not intended to replace advice given to you by your health care provider. Make sure you discuss any questions you have with your health care provider. Document Released: 09/12/2005 Document Revised: 04/12/2016 Document Reviewed: 07/21/2014 Elsevier Interactive Patient Education  2017 [employer] Inc[employer]   Perirectal Abscess Introduction An abscess is an infected area that contains a collection of pus. A perirectal abscess is an abscess that is near the opening of the anus or around the rectum. A perirectal abscess can cause a lot of pain, especially during bowel movements. What are the causes? This condition is almost always caused by an infection that starts in an anal gland. What  increases the risk? This condition is more likely to develop in:  People with diabetes or inflammatory bowel disease.  People whose body defense system (immune system) is weak.  People who have anal sex.  People who have a sexually  transmitted disease (STD).  People who have certain kinds of cancers, such as rectal carcinoma, leukemia, or lymphoma. What are the signs or symptoms? The main symptom of this condition is pain. The pain may be a throbbing pain that gets worse during bowel movements. Other symptoms include:  Fever.  Swelling.  Redness.  Bleeding.  Constipation. How is this diagnosed? The condition is diagnosed with a physical exam. If the abscess is not visible, a health care provider may need to place a finger inside the rectum to find the abscess. Sometimes, imaging tests are done to determine the size and location of the abscess. These tests may include:  An ultrasound.  An MRI.  A CT scan. How is this treated? This condition is usually treated with incision and drainage surgery. Incision and drainage surgery involves making an incision over the abscess to drain the pus. Treatment may also involve antibiotic medicine, pain medicine, stool softeners, or laxatives. Follow these instructions at home:  Take medicines only as directed by your health care provider.  If you were prescribed an antibiotic, finish all of it even if you start to feel better.  To relieve pain, try sitting:  In a warm, shallow bath (sitz bath).  On a heating pad with the setting on low.  On an inflatable donut-shaped cushion.  Follow any diet instructions as directed by your health care provider.  Keep all follow-up visits as directed by your health care provider. This is important. Contact a health care provider if:  Your abscess is bleeding.  You have pain, swelling, or redness that is getting worse.  You are constipated.  You feel ill.  You have muscle aches or  chills.  You have a fever.  Your symptoms return after the abscess has healed. This information is not intended to replace advice given to you by your health care provider. Make sure you discuss any questions you have with your health care provider. Document Released: 11/30/2000 Document Revised: 05/10/2016 Document Reviewed: 10/13/2014  2017 Elsevier

## 2017-02-01 NOTE — Progress Notes (Signed)
[facility] PROGRESS NOTE Subjective patient gives me additional history this morning. He was groggy from pain medication yesterday and forgot to tell me that he has been diagnosed in [place] over the past year with Crohn's disease and he did have a colonoscopy by his gastroenterologist in [place] where he lives while home from winter break from college and was started on prednisone 30 mg daily. He feels much more alert and better this morning but his bottom is still hurting.  Objective: Vital signs in last 24 hours: Temp:  [98.6 F (37 C)-102.5 F (39.2 C)] 98.7 F (37.1 C) (02/16 0200) Pulse Rate:  [86-111] 86 (02/16 0200) Resp:  [16-18] 18 (02/16 0200) BP: (110-127)/(69-78) 127/78 (02/16 0200) SpO2:  [98 %-100 %] 100 % (02/16 0200) Last BM Date: 01/29/17  Intake/Output from previous day: 02/15 0701 - 02/16 0700 In: 1530 [P.O.:1380; IV Piggyback:150] Out: 2650 [Urine:2650] Intake/Output this shift: Total I/O In: 490 [P.O.:440; IV Piggyback:50] Out: 350 [Urine:350]    Lab Results:  Recent Labs  01/30/17 1655 01/31/17 0427  WBC 18.9* 21.9*  HGB 8.8* 8.0*  HCT 28.5* 26.3*  PLT 361 343   BMET  Recent Labs  01/30/17 1655 01/31/17 0427  NA 139 139  K 3.3* 3.5  CL 103 102  CO2 30 30  CREATININE 0.85 0.97   LFT No results for input(s): PROT, AST, ALT, ALKPHOS, BILITOT, BILIDIR, IBILI in the last 72 hours. PT/INR No results for input(s): LABPROT, INR in the last 72 hours. PANCREAS No results for input(s): LIPASE in the last 72 hours.       Studies/Results: Ct Abdomen Pelvis W Contrast  Result Date: 01/30/2017 CLINICAL DATA:  21 y/o  M; rectal pain.  History of Crohn's disease. EXAM: CT ABDOMEN AND PELVIS WITH CONTRAST TECHNIQUE: Multidetector CT imaging of the abdomen and pelvis was performed using the standard protocol following bolus administration of intravenous contrast. CONTRAST:  ISOVUE-300 IOPAMIDOL (ISOVUE-300) INJECTION 61%, 1 ISOVUE-300  IOPAMIDOL (ISOVUE-300) INJECTION 61% COMPARISON:  12/28/2016 CT abdomen and pelvis. FINDINGS: Lower chest: No acute abnormality. Hepatobiliary: No focal liver abnormality is seen. No gallstones, gallbladder wall thickening, or biliary dilatation. Pancreas: Unremarkable. No pancreatic ductal dilatation or surrounding inflammatory changes. Spleen: Normal in size without focal abnormality. Adrenals/Urinary Tract: Adrenal glands are unremarkable. Kidneys are normal, without renal calculi, focal lesion, or hydronephrosis. Bladder is unremarkable. Stomach/Bowel: Stomach is within normal limits. Appendix appears normal. No evidence of bowel wall thickening, distention, or inflammatory changes. Vascular/Lymphatic: No significant vascular findings are present. No enlarged abdominal or pelvic lymph nodes. Reproductive: Uterus and bilateral adnexa are unremarkable. Other: Perianal fluid collection measuring up to 51 x 53 x 51 mm (AP x ML x CC series 2, image 85 and 81, series 4, image 85). The collection demonstrates peripheral enhancement and extends from approximately 2:00 to 9:00 wrapping around the left lateral, posterior, and right lateral aspects of the anus (series 2, image 85). No intraperitoneal extension is identified. Musculoskeletal: No acute or significant osseous findings. IMPRESSION: Perianal fluid collection likely representing an abscess measuring up to 53 mm wrapping around the left lateral, posterior, and right lateral aspects of the anus. Electronically Signed   By: Mitzi Hansen M.D.   On: 01/30/2017 16:24    Medications: I have reviewed the patient's current medications.  Assessment/Plan: 1. Crohn's disease. This was diagnosed within the past year or so in [place] don't have any records. Discussed with him the various treatments that can be used in the fact that  prednisone is the most dangerous long-term therapy and that it some point you may want to consider a biologic. I have asked him to  come and see me 1 to 2 weeks after discharge and will discuss further treatment and make attempts to taper him from his prednisone. He is currently on 20 mg daily and I would go ahead and discharge him on that dose. He has my card and will call and make an appointment   Nichlas Pitera JR,Shunna Mikaelian L 02/01/2017, 6:05 AM  This note was created using voice recognition software. Minor errors may Have occurred unintentionally.  Pager: [phone] If no answer or after hours call [phone]

## 2017-02-01 NOTE — Progress Notes (Signed)
Assessment Horseshoe Perirectal abscess s/p I&D of Horseshoe perirectal abscess 01/30/17 Dr. Molli Hazard Martin-temperature curve down, WBC down-does not need to go back to OR  Crohn's disease-on Prednisone  Plan:  Slowly remove packing-1/2 inch per day.  Continue IV abxs.  Hopefully home tomorrow on oral abxs.   LOS: 1 day     2 Days Post-Op  Subjective: Still sore in perianal area.  Objective: Vital signs in last 24 hours: Temp:  [98.2 F (36.8 C)-102.5 F (39.2 C)] 98.2 F (36.8 C) (02/16 0600) Pulse Rate:  [72-111] 72 (02/16 0600) Resp:  [16-18] 18 (02/16 0600) BP: (110-127)/(65-78) 114/65 (02/16 0600) SpO2:  [98 %-100 %] 100 % (02/16 0600) Last BM Date: 01/29/17  Intake/Output from previous day: 02/15 0701 - 02/16 0700 In: 1580 [P.O.:1380; IV Piggyback:200] Out: 2650 [Urine:2650] Intake/Output this shift: No intake/output data recorded.  PE: General- In NAD Anorectal-perianal area is soft, right sided wound with packing present; packing pulled out about 1.5 cm.  Lab Results:   Recent Labs  01/31/17 0427 02/01/17 0531  WBC 21.9* 16.3*  HGB 8.0* 7.6*  HCT 26.3* 24.3*  PLT 343 315   BMET  Recent Labs  01/31/17 0427 02/01/17 0531  NA 139 142  K 3.5 3.0*  CL 102 103  CO2 30 33*  GLUCOSE 93 102*  BUN 9 8  CREATININE 0.97 0.93  CALCIUM 9.1 8.6*   PT/INR No results for input(s): LABPROT, INR in the last 72 hours. Comprehensive Metabolic Panel:    Component Value Date/Time   NA 142 02/01/2017 0531   NA 139 01/31/2017 0427   K 3.0 (L) 02/01/2017 0531   K 3.5 01/31/2017 0427   CL 103 02/01/2017 0531   CL 102 01/31/2017 0427   CO2 33 (H) 02/01/2017 0531   CO2 30 01/31/2017 0427   BUN 8 02/01/2017 0531   BUN 9 01/31/2017 0427   CREATININE 0.93 02/01/2017 0531   CREATININE 0.97 01/31/2017 0427   GLUCOSE 102 (H) 02/01/2017 0531   GLUCOSE 93 01/31/2017 0427   CALCIUM 8.6 (L) 02/01/2017 0531   CALCIUM 9.1 01/31/2017 0427   AST 16 12/27/2016 1720    ALT 12 (L) 12/27/2016 1720   ALKPHOS 62 12/27/2016 1720   BILITOT 1.1 12/27/2016 1720   PROT 6.9 12/27/2016 1720   ALBUMIN 3.9 12/27/2016 1720     Studies/Results: Ct Abdomen Pelvis W Contrast  Result Date: 01/30/2017 CLINICAL DATA:  21 y/o  M; rectal pain.  History of Crohn's disease. EXAM: CT ABDOMEN AND PELVIS WITH CONTRAST TECHNIQUE: Multidetector CT imaging of the abdomen and pelvis was performed using the standard protocol following bolus administration of intravenous contrast. CONTRAST:  ISOVUE-300 IOPAMIDOL (ISOVUE-300) INJECTION 61%, 1 ISOVUE-300 IOPAMIDOL (ISOVUE-300) INJECTION 61% COMPARISON:  12/28/2016 CT abdomen and pelvis. FINDINGS: Lower chest: No acute abnormality. Hepatobiliary: No focal liver abnormality is seen. No gallstones, gallbladder wall thickening, or biliary dilatation. Pancreas: Unremarkable. No pancreatic ductal dilatation or surrounding inflammatory changes. Spleen: Normal in size without focal abnormality. Adrenals/Urinary Tract: Adrenal glands are unremarkable. Kidneys are normal, without renal calculi, focal lesion, or hydronephrosis. Bladder is unremarkable. Stomach/Bowel: Stomach is within normal limits. Appendix appears normal. No evidence of bowel wall thickening, distention, or inflammatory changes. Vascular/Lymphatic: No significant vascular findings are present. No enlarged abdominal or pelvic lymph nodes. Reproductive: Uterus and bilateral adnexa are unremarkable. Other: Perianal fluid collection measuring up to 51 x 53 x 51 mm (AP x ML x CC series 2, image 85 and 81, series 4,  image 85). The collection demonstrates peripheral enhancement and extends from approximately 2:00 to 9:00 wrapping around the left lateral, posterior, and right lateral aspects of the anus (series 2, image 85). No intraperitoneal extension is identified. Musculoskeletal: No acute or significant osseous findings. IMPRESSION: Perianal fluid collection likely representing an abscess  measuring up to 53 mm wrapping around the left lateral, posterior, and right lateral aspects of the anus. Electronically Signed   By: Mitzi Hansen M.D.   On: 01/30/2017 16:24    Anti-infectives: Anti-infectives    Start     Dose/Rate Route Frequency Ordered Stop   01/31/17 0400  piperacillin-tazobactam (ZOSYN) IVPB 3.375 g     3.375 g 12.5 mL/hr over 240 Minutes Intravenous Every 8 hours 01/30/17 2149     01/30/17 1830  piperacillin-tazobactam (ZOSYN) IVPB 3.375 g     3.375 g 12.5 mL/hr over 240 Minutes Intravenous  Once 01/30/17 1823 01/30/17 1913       Merland Holness J 02/01/2017

## 2017-02-02 DIAGNOSIS — K5090 Crohn's disease, unspecified, without complications: Secondary | ICD-10-CM

## 2017-02-02 NOTE — Progress Notes (Signed)
Patient is being discharged home, patient is alert and oriented, vital signs are stable, oral pain medication effective, discharge instructions reviewed with patient Jared Marshall 2:45 PM 02-02-2017

## 2017-02-02 NOTE — Discharge Summary (Signed)
Physician Discharge Summary  Patient ID: Jared Marshall MRN: [id number] DOB/AGE: Mar 18, 1996  21 y.o.  Admit date: 01/30/2017 Discharge date:   Patient Care Team: Provider Not In System as PCP - General Laurence Spates, MD as Consulting Physician (Gastroenterology) Jackolyn Confer, MD as Consulting Physician (General Surgery)  Discharge Diagnoses:  Principal Problem:   Perirectal abscess Active Problems:   Crohn's disease [name]   POST-OPERATIVE DIAGNOSIS:   perirectal abscess  SURGERY:  01/30/2017  Procedure(s): EXAM UNDER ANESTHESIA, IRRIGATION AND DEBRIDEMENT PERIRECTAL ABSCESS  SURGEON:    Surgeon(s): Johnathan Hausen, MD  Consults: GI  Hospital Course:   Young active male.  Student at [employer].  Came in with severe anal pain and found to have a horseshoe-shaped abscess.  Underwent surgical drainage.   Placed on IV antibiotics.  Large volume of wick placed in.  Operating surgeon wished the patient to gradually pull out the wick over the next week.  Postoperatively, the patient gradually mobilized and advanced to a solid diet.  Pain and other symptoms were treated aggressively.   Patient recalls being diagnosed with Crohn disease up in [place], [place].  Gastroenterology was consulted.  Dr. Oletta Lamas with [facility].  Patient is to remain on prednisone with outpatient follow-up to consider adjustment to a biologic therapy.  By the time of discharge, the patient was walking well the hallways, eating food, having flatus.  Pain was well-controlled on an oral medications.  Based on meeting discharge criteria and continuing to recover, I felt it was safe for the patient to be discharged from the hospital to further recover with close followup. Postoperative recommendations were discussed in detail.  They are written as well.  Discharged Condition: good  Disposition:  Follow-up Information    MARTIN,MATTHEW B, MD Follow up in 1 week(s).   Specialty:  General Surgery Why:  CAll for follow up  with Dr Hassell Done in 2 weeks. Contact information: [address] [place] [address] [phone]        EDWARDS JR,JAMES L, MD Follow up.   Specialty:  Gastroenterology Why:  Call for follow up in 1-2 weeks Contact information: [address] [place] [address] [phone]           01-Home or Self Care  Discharge Instructions    Call MD for:  hives    Complete by:  As directed    Call MD for:  persistant dizziness or light-headedness    Complete by:  As directed    Call MD for:  persistant nausea and vomiting    Complete by:  As directed    Call MD for:  redness, tenderness, or signs of infection (pain, swelling, redness, odor or green/yellow discharge around incision site)    Complete by:  As directed    You will often notice bleeding with bowel movements.  Expect some yellow or tan drainage.  This can occur for weeks, but should be mild by the end of the first week of surgery.  Wear an absorbent pad or soft cotton gauze in your underwear until the drainage stops   Call MD for:  severe uncontrolled pain    Complete by:  As directed    Diet - low sodium heart healthy    Complete by:  As directed    Discharge instructions    Complete by:  As directed    See Rectal Surgery instruction sheet   Discharge wound care:    Complete by:  As directed  Pull out the wick out the anal wound ~ 2 inches a day, trimming excess off.  Should fall out by 4 days Wear an absorbent pad or soft cotton gauze in your underwear as needed to catch any drainage and help keep the area  Keep the area clean and dry.  Bathe / shower every day.  Keep the area clean by showering / bathing over the incision / wound.   It is okay to soak an open wound to help wash it.   Wet wipes or showers / gentle washing after bowel movements is often less traumatic than regular toilet paper. You will often notice bleeding with bowel movements.  This should slow down by the end of the  first week of surgery Expect some drainage for a few weeks.  This should slow down by the end of the first week of surgery.  Wear an absorbent pad or soft cotton gauze in your underwear until the drainage stops.   Driving Restrictions    Complete by:  As directed    You may drive when you are no longer taking prescription pain medication, you can comfortably sit for long periods of time, and you can safely maneuver your car and apply brakes.   Increase activity slowly    Complete by:  As directed    Lifting restrictions    Complete by:  As directed    You may resume regular (light) daily activities beginning the next day-such as daily self-care, walking, climbing stairs-gradually increasing activities as tolerated.  If you can walk 30 minutes without difficulty, it is safe to try more intense activity such as jogging, treadmill, bicycling, low-impact aerobics, swimming, etc. Save the most intensive and strenuous activity for last such as sit-ups, heavy lifting, contact sports, etc  Refrain from any heavy lifting or straining until you are off narcotics for pain control.  Remember: if it hurts to do it, don't do it: STOP   May shower / Bathe    Complete by:  As directed    Warm water sitz baths/tub soaks x 20-30 minutes, 4-8 times a day for comfort   May walk up steps    Complete by:  As directed    Sexual Activity Restrictions    Complete by:  As directed    You may have sexual intercourse when it is comfortable. If it hurts to do it, STOP.      Allergies as of 02/02/2017   No Known Allergies     Medication List    TAKE these medications   balsalazide 750 MG capsule Commonly known as:  COLAZAL Take by mouth as directed. Take 5 capsules in am and 4 Capsules at night   ciprofloxacin 500 MG tablet Commonly known as:  CIPRO Take 1 tablet (500 mg total) by mouth 2 (two) times daily.   metroNIDAZOLE 500 MG tablet Commonly known as:  FLAGYL Take 1 tablet (500 mg total) by mouth 3  (three) times daily.   oxyCODONE-acetaminophen 5-325 MG tablet Commonly known as:  PERCOCET/ROXICET Take 1-2 tablets by mouth every 4 (four) hours as needed for moderate pain.   predniSONE 10 MG tablet Commonly known as:  DELTASONE Take 30 mg by mouth daily with breakfast.       Significant Diagnostic Studies:  Results for orders placed or performed during the hospital encounter of 01/30/17 (from the past 72 hour(s))  CBC with Differential     Status: Abnormal   Collection Time: 01/30/17  4:55 PM  Result Value Ref Range   WBC 18.9 (H) 4.0 - 10.5 K/uL   RBC 3.70 (L) 4.22 - 5.81 MIL/uL   Hemoglobin 8.8 (L) 13.0 - 17.0 g/dL   HCT 28.5 (L) 39.0 - 52.0 %   MCV 77.0 (L) 78.0 - 100.0 fL   MCH 23.8 (L) 26.0 - 34.0 pg   MCHC 30.9 30.0 - 36.0 g/dL   RDW 14.5 11.5 - 15.5 %   Platelets 361 150 - 400 K/uL   Neutrophils Relative % 81 %   Neutro Abs 15.4 (H) 1.7 - 7.7 K/uL   Lymphocytes Relative 10 %   Lymphs Abs 1.8 0.7 - 4.0 K/uL   Monocytes Relative 9 %   Monocytes Absolute 1.6 (H) 0.1 - 1.0 K/uL   Eosinophils Relative 0 %   Eosinophils Absolute 0.0 0.0 - 0.7 K/uL   Basophils Relative 0 %   Basophils Absolute 0.0 0.0 - 0.1 K/uL  Basic metabolic panel     Status: Abnormal   Collection Time: 01/30/17  4:55 PM  Result Value Ref Range   Sodium 139 135 - 145 mmol/L   Potassium 3.3 (L) 3.5 - 5.1 mmol/L   Chloride 103 101 - 111 mmol/L   CO2 30 22 - 32 mmol/L   Glucose, Bld 83 65 - 99 mg/dL   BUN 7 6 - 20 mg/dL   Creatinine, Ser 0.85 0.61 - 1.24 mg/dL   Calcium 8.6 (L) 8.9 - 10.3 mg/dL   GFR calc non Af Amer >60 >60 mL/min   GFR calc Af Amer >60 >60 mL/min    Comment: (NOTE) The eGFR has been calculated using the CKD EPI equation. This calculation has not been validated in all clinical situations. eGFR's persistently <60 mL/min signify possible Chronic Kidney Disease.    Anion gap 6 5 - 15  Aerobic/Anaerobic Culture (surgical/deep wound)     Status: None   Collection Time:  01/30/17  7:26 PM  Result Value Ref Range   Specimen Description ABSCESS DEEP PERIRECTAL    Gram Stain      MODERATE WBC PRESENT, PREDOMINANTLY PMN ABUNDANT GRAM POSITIVE COCCI IN CHAINS MODERATE GRAM NEGATIVE RODS Performed at [facility], [place] [address], [place], [place] [address]    Culture MULTIPLE ORGANISMS PRESENT, NONE PREDOMINANT    Report Status 02/01/2017 FINAL   CBC     Status: Abnormal   Collection Time: 01/31/17  4:27 AM  Result Value Ref Range   WBC 21.9 (H) 4.0 - 10.5 K/uL   RBC 3.50 (L) 4.22 - 5.81 MIL/uL   Hemoglobin 8.0 (L) 13.0 - 17.0 g/dL   HCT 26.3 (L) 39.0 - 52.0 %   MCV 75.1 (L) 78.0 - 100.0 fL   MCH 22.9 (L) 26.0 - 34.0 pg   MCHC 30.4 30.0 - 36.0 g/dL   RDW 14.4 11.5 - 15.5 %   Platelets 343 150 - 400 K/uL  Basic metabolic panel     Status: None   Collection Time: 01/31/17  4:27 AM  Result Value Ref Range   Sodium 139 135 - 145 mmol/L   Potassium 3.5 3.5 - 5.1 mmol/L   Chloride 102 101 - 111 mmol/L   CO2 30 22 - 32 mmol/L   Glucose, Bld 93 65 - 99 mg/dL   BUN 9 6 - 20 mg/dL   Creatinine, Ser 0.97 0.61 - 1.24 mg/dL   Calcium 9.1 8.9 - 10.3 mg/dL   GFR calc non Af Amer >60 >60 mL/min  GFR calc Af Amer >60 >60 mL/min    Comment: (NOTE) The eGFR has been calculated using the CKD EPI equation. This calculation has not been validated in all clinical situations. eGFR's persistently <60 mL/min signify possible Chronic Kidney Disease.    Anion gap 7 5 - 15  CBC     Status: Abnormal   Collection Time: 02/01/17  5:31 AM  Result Value Ref Range   WBC 16.3 (H) 4.0 - 10.5 K/uL   RBC 3.26 (L) 4.22 - 5.81 MIL/uL   Hemoglobin 7.6 (L) 13.0 - 17.0 g/dL   HCT 24.3 (L) 39.0 - 52.0 %   MCV 74.5 (L) 78.0 - 100.0 fL   MCH 23.3 (L) 26.0 - 34.0 pg   MCHC 31.3 30.0 - 36.0 g/dL   RDW 14.5 11.5 - 15.5 %   Platelets 315 150 - 400 K/uL  Basic metabolic panel     Status: Abnormal   Collection Time: 02/01/17  5:31 AM  Result Value Ref Range   Sodium 142 135 -  145 mmol/L   Potassium 3.0 (L) 3.5 - 5.1 mmol/L   Chloride 103 101 - 111 mmol/L   CO2 33 (H) 22 - 32 mmol/L   Glucose, Bld 102 (H) 65 - 99 mg/dL   BUN 8 6 - 20 mg/dL   Creatinine, Ser 0.93 0.61 - 1.24 mg/dL   Calcium 8.6 (L) 8.9 - 10.3 mg/dL   GFR calc non Af Amer >60 >60 mL/min   GFR calc Af Amer >60 >60 mL/min    Comment: (NOTE) The eGFR has been calculated using the CKD EPI equation. This calculation has not been validated in all clinical situations. eGFR's persistently <60 mL/min signify possible Chronic Kidney Disease.    Anion gap 6 5 - 15    Ct Abdomen Pelvis W Contrast  Result Date: 01/30/2017 CLINICAL DATA:  21 y/o  M; rectal pain.  History of Crohn's disease. EXAM: CT ABDOMEN AND PELVIS WITH CONTRAST TECHNIQUE: Multidetector CT imaging of the abdomen and pelvis was performed using the standard protocol following bolus administration of intravenous contrast. CONTRAST:  144m ISOVUE-300 IOPAMIDOL (ISOVUE-300) INJECTION 61%, 1 ISOVUE-300 IOPAMIDOL (ISOVUE-300) INJECTION 61% COMPARISON:  12/28/2016 CT abdomen and pelvis. FINDINGS: Lower chest: No acute abnormality. Hepatobiliary: No focal liver abnormality is seen. No gallstones, gallbladder wall thickening, or biliary dilatation. Pancreas: Unremarkable. No pancreatic ductal dilatation or surrounding inflammatory changes. Spleen: Normal in size without focal abnormality. Adrenals/Urinary Tract: Adrenal glands are unremarkable. Kidneys are normal, without renal calculi, focal lesion, or hydronephrosis. Bladder is unremarkable. Stomach/Bowel: Stomach is within normal limits. Appendix appears normal. No evidence of bowel wall thickening, distention, or inflammatory changes. Vascular/Lymphatic: No significant vascular findings are present. No enlarged abdominal or pelvic lymph nodes. Reproductive: Uterus and bilateral adnexa are unremarkable. Other: Perianal fluid collection measuring up to 51 x 53 x 51 mm (AP x ML x CC series 2, image 85 and  81, series 4, image 85). The collection demonstrates peripheral enhancement and extends from approximately 2:00 to 9:00 wrapping around the left lateral, posterior, and right lateral aspects of the anus (series 2, image 85). No intraperitoneal extension is identified. Musculoskeletal: No acute or significant osseous findings. IMPRESSION: Perianal fluid collection likely representing an abscess measuring up to 53 mm wrapping around the left lateral, posterior, and right lateral aspects of the anus. Electronically Signed   By: LKristine GarbeM.D.   On: 01/30/2017 16:24    Discharge Exam: Blood pressure 120/74, pulse 86, temperature 97.8 F (  36.6 C), temperature source Oral, resp. rate 16, height 5' 10"  (1.778 m), weight 72.6 kg (160 lb), SpO2 99 %.  General: Pt awake/alert/oriented x4 in No acute distress Eyes: PERRL, normal EOM.  Sclera clear.  No icterus Neuro: CN II-XII intact w/o focal sensory/motor deficits. Lymph: No head/neck/groin lymphadenopathy Psych:  No delerium/psychosis/paranoia HENT: Normocephalic, Mucus membranes moist.  No thrush Neck: Supple, No tracheal deviation Chest: No chest wall pain w good excursion CV:  Pulses intact.  Regular rhythm MS: Normal AROM mjr joints.  No obvious deformity Abdomen: Soft.  Nondistended.  Nontender.  No evidence of peritonitis.  No incarcerated hernias. GU: NEMG.  Rectal right lateral 15 mm perirectal wound with quarter inch Nu Gauze.  Pulled another 2 inches out without difficulty.  Tenderness right anterior but overall much less fluctuance than primary previously described.  Ext:  SCDs BLE.  No mjr edema.  No cyanosis Skin: No petechiae / purpura  Past Medical History:  Diagnosis Date  . Abnormal colonoscopy   . Asthma   . Crohn's disease [facility])     Past Surgical History:  Procedure Laterality Date  . INCISION AND DRAINAGE PERIRECTAL ABSCESS N/A 01/30/2017   Procedure: EXAM UNDER ANESTHESIA, IRRIGATION AND DEBRIDEMENT  PERIRECTAL ABSCESS;  Surgeon: Johnathan Hausen, MD;  Location: [facility];  Service: General;  Laterality: N/A;    Social History   Social History  . Marital status: Single    Spouse name: N/A  . Number of children: N/A  . Years of education: N/A   Occupational History  . Not on file.   Social History Main Topics  . Smoking status: Never Smoker  . Smokeless tobacco: Never Used  . Alcohol use No  . Drug use: No  . Sexual activity: Not on file   Other Topics Concern  . Not on file   Social History Narrative  . No narrative on file    History reviewed. No pertinent family history.  Current Facility-Administered Medications  Medication Dose Route Frequency Provider Last Rate Last Dose  . acetaminophen (TYLENOL) tablet 650 mg  650 mg Oral Q6H PRN Johnathan Hausen, MD   650 mg at 02/01/17 1201  . heparin injection 5,000 Units  5,000 Units Subcutaneous Q8H Earnstine Regal, PA-C   5,000 Units at 02/02/17 [phone]  . menthol-cetylpyridinium (CEPACOL) lozenge 3 mg  1 lozenge Oral PRN Johnathan Hausen, MD      . Mesalamine (ASACOL) DR capsule 800 mg  800 mg Oral TID Laurence Spates, MD   800 mg at 02/01/17 2217  . morphine 4 MG/ML injection 1 mg  1 mg Intravenous Q1H PRN Johnathan Hausen, MD   1 mg at 01/31/17 1154  . oxyCODONE-acetaminophen (PERCOCET/ROXICET) 5-325 MG per tablet 1-2 tablet  1-2 tablet Oral Q4H PRN Earnstine Regal, PA-C   2 tablet at 02/02/17 0127  . phenol (CHLORASEPTIC) mouth spray 1 spray  1 spray Mouth/Throat PRN Johnathan Hausen, MD      . piperacillin-tazobactam (ZOSYN) IVPB 3.375 g  3.375 g Intravenous Q8H Johnathan Hausen, MD   3.375 g at 02/02/17 0358  . predniSONE (DELTASONE) tablet 20 mg  20 mg Oral q morning - 10a Laurence Spates, MD   20 mg at 02/01/17 1100     No Known Allergies  Signed: Morton Peters, M.D., F.A.C.S. Gastrointestinal and Minimally Invasive Surgery [facility] [address], [place], [place]  [address] [phone] Main / Paging   02/02/2017, 8:38 AM

## 2017-09-08 IMAGING — CT CT ABD-PELV W/ CM
2 of 4 series · 16 of 46 positions shown, 18 images · IV contrast (ISOVUE)
Comparison: 12/28/2016 CT abdomen and pelvis.

CLINICAL DATA: 20 y/o  M; rectal pain.  History of Crohn's disease.

EXAM:
CT ABDOMEN AND PELVIS WITH CONTRAST
TECHNIQUE: Multidetector CT imaging of the abdomen and pelvis was performed
using the standard protocol following bolus administration of
intravenous contrast.
CONTRAST:  100mL 043NS3-P11 IOPAMIDOL (043NS3-P11) INJECTION 61%, 1
043NS3-P11 IOPAMIDOL (043NS3-P11) INJECTION 61%

[Series 2: abd/pel with · axial · 0.74mm/px · z∈[+1237,+1632]mm · 13 of 89 slices shown, 15 images]
[im 5/89  soft-tissue]
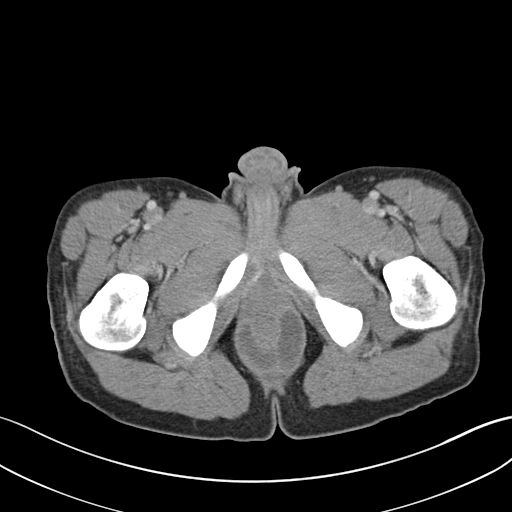
[im 5/89  bone]
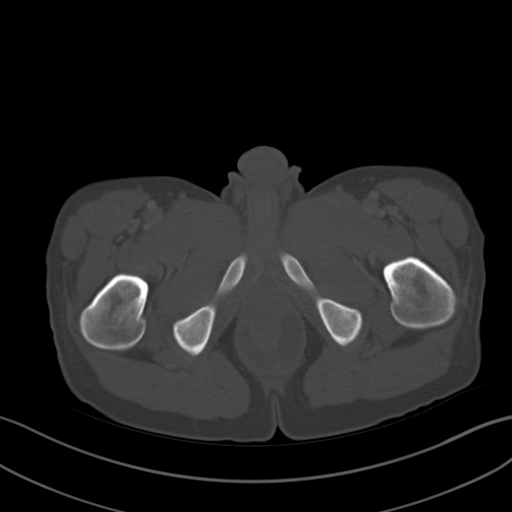
[im 10/89  soft-tissue]
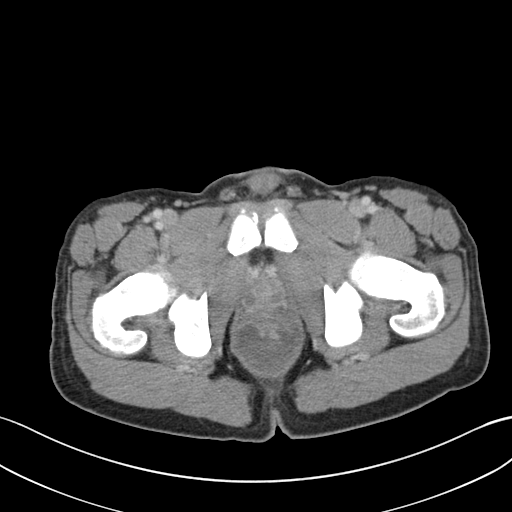
[im 20/89  soft-tissue]
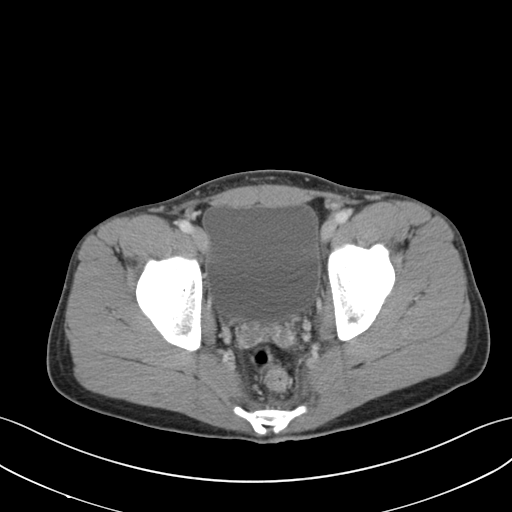
[im 25/89  soft-tissue]
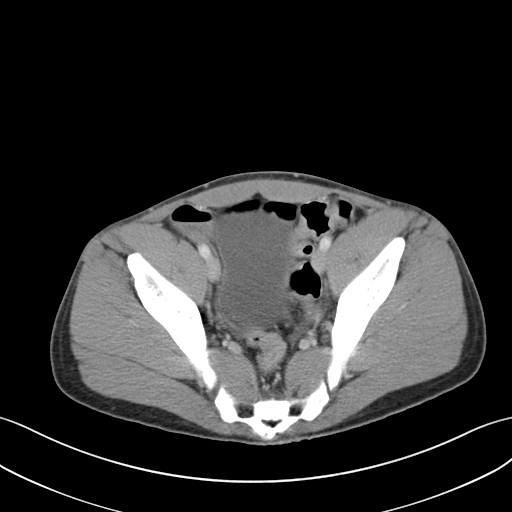
[im 30/89  soft-tissue]
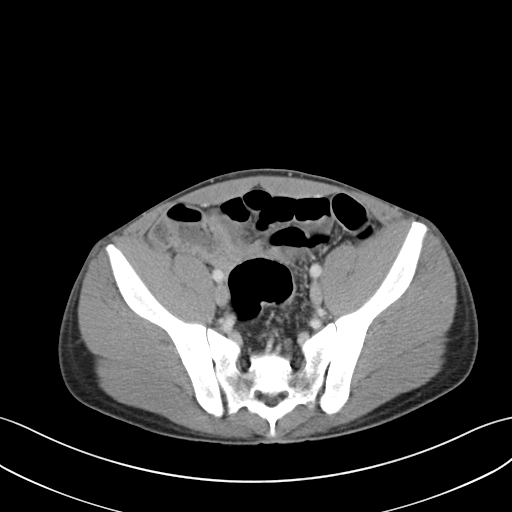
[im 40/89  soft-tissue]
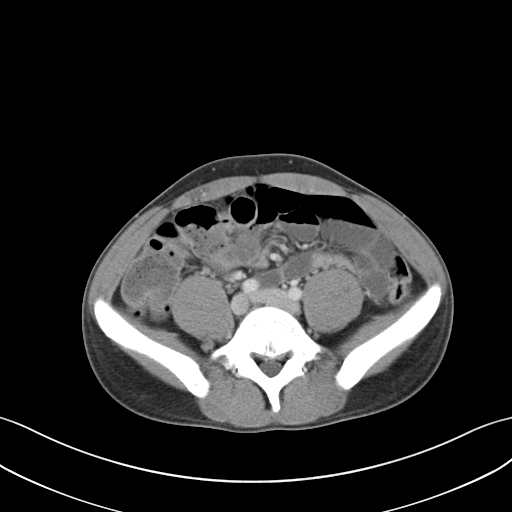
[im 45/89  soft-tissue]
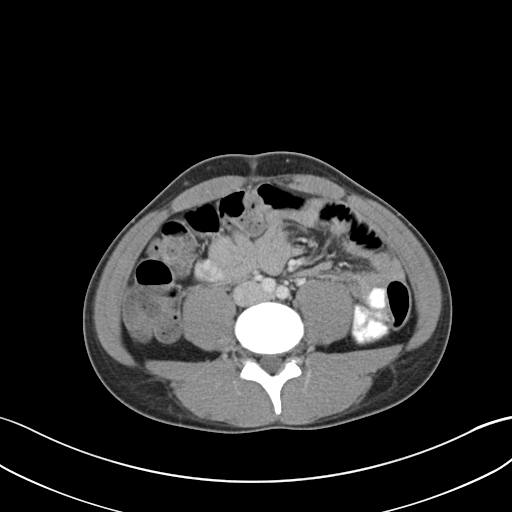
[im 49/89  soft-tissue]
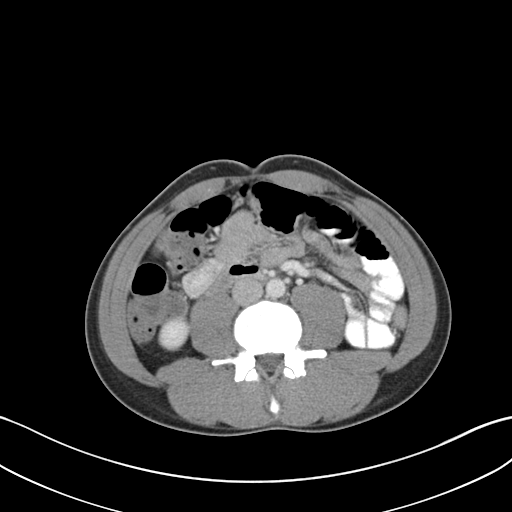
[im 59/89  soft-tissue]
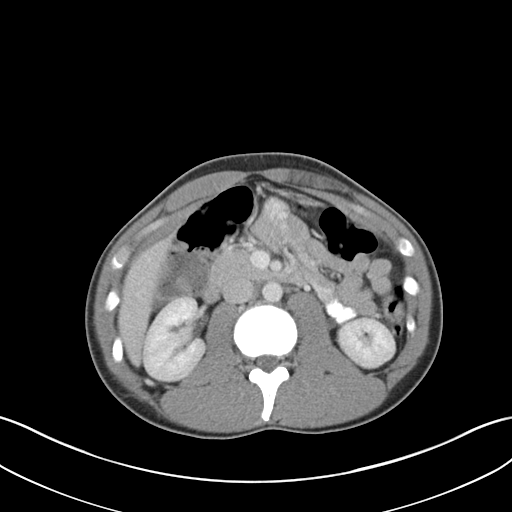
[im 59/89  bone]
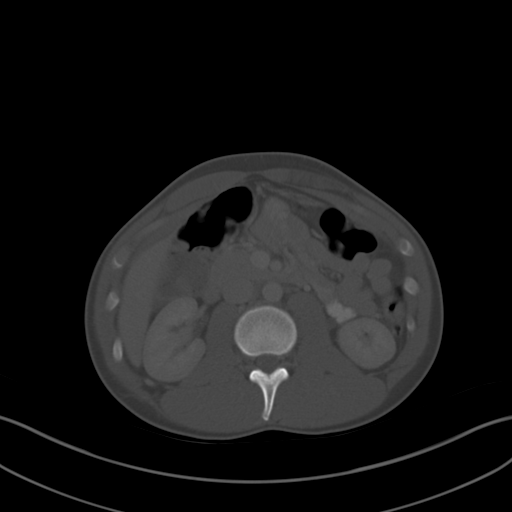
[im 64/89  soft-tissue]
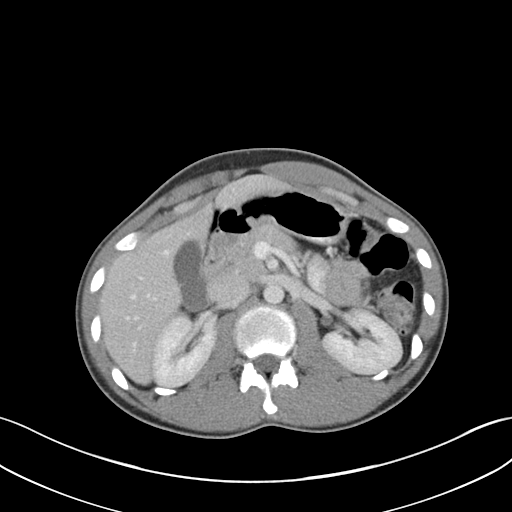
[im 69/89  soft-tissue]
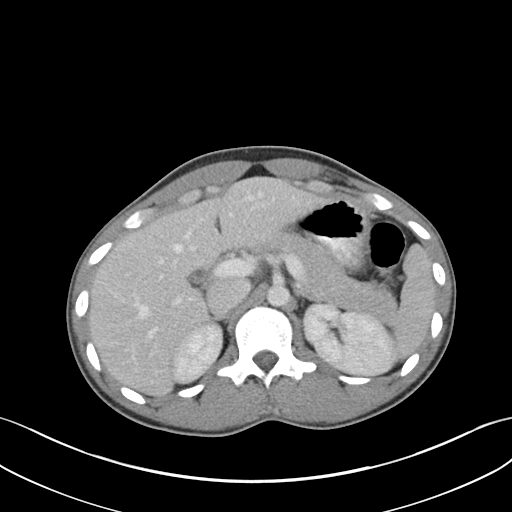
[im 79/89  soft-tissue]
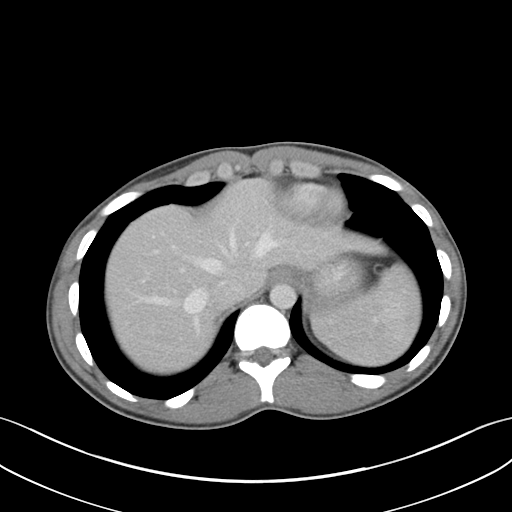
[im 84/89  soft-tissue]
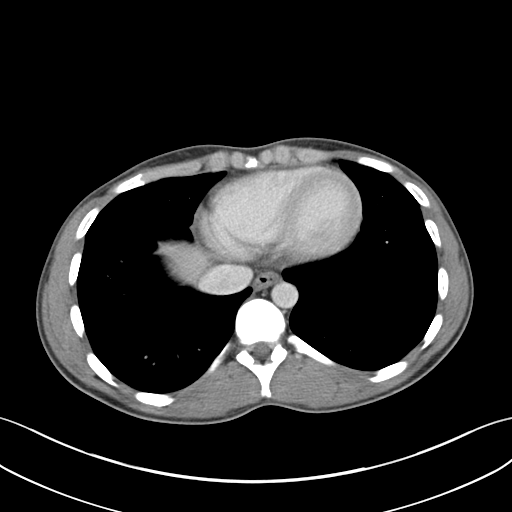

[Series 3: coronal a/|p · coronal · 0.81mm/px · 3 of 108 slices shown]
[im 36/108  soft-tissue]
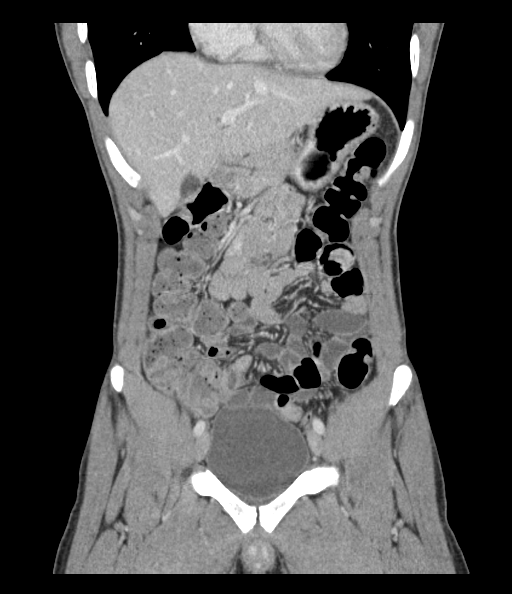
[im 48/108  soft-tissue]
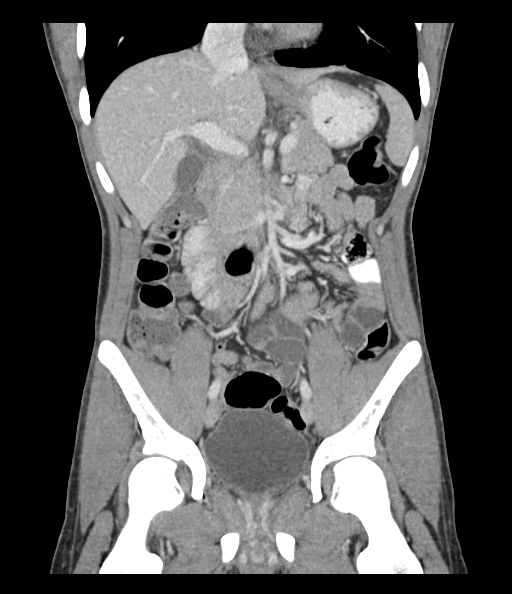
[im 60/108  soft-tissue]
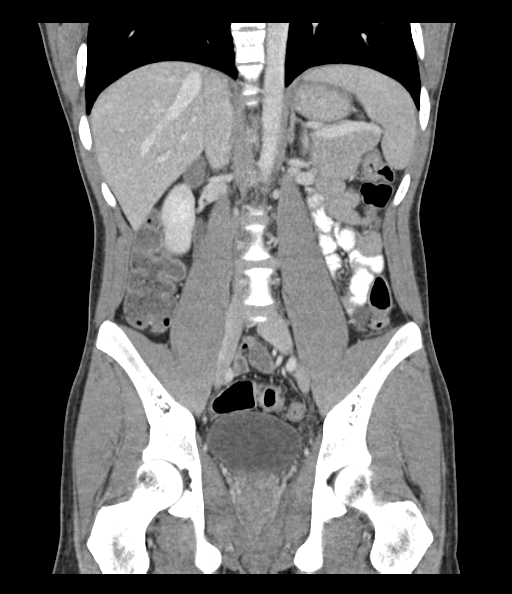

[16 of 46 positions shown; findings below may reference images not displayed]

FINDINGS: Lower chest: No acute abnormality.

Hepatobiliary: No focal liver abnormality is seen. No gallstones,
gallbladder wall thickening, or biliary dilatation.

Pancreas: Unremarkable. No pancreatic ductal dilatation or
surrounding inflammatory changes.

Spleen: Normal in size without focal abnormality.

Adrenals/Urinary Tract: Adrenal glands are unremarkable. Kidneys are
normal, without renal calculi, focal lesion, or hydronephrosis.
Bladder is unremarkable.

Stomach/Bowel: Stomach is within normal limits. Appendix appears
normal. No evidence of bowel wall thickening, distention, or
inflammatory changes.

Vascular/Lymphatic: No significant vascular findings are present. No
enlarged abdominal or pelvic lymph nodes.

Reproductive: Uterus and bilateral adnexa are unremarkable.

Other: Perianal fluid collection measuring up to 51 x 53 x 51 mm (AP
x ML x CC series 2, image 85 and 81, series 4, image 85). The
collection demonstrates peripheral enhancement and extends from
approximately [DATE] to [DATE] wrapping around the left lateral,
posterior, and right lateral aspects of the anus (series 2, image
85). No intraperitoneal extension is identified.

Musculoskeletal: No acute or significant osseous findings.
IMPRESSION: Perianal fluid collection likely representing an abscess measuring
up to 53 mm wrapping around the left lateral, posterior, and right
lateral aspects of the anus.

By: Manuella Tiger M.D.

## 2018-03-22 ENCOUNTER — Encounter (HOSPITAL_COMMUNITY): Payer: Self-pay

## 2018-03-22 ENCOUNTER — Other Ambulatory Visit: Payer: Self-pay

## 2018-03-22 ENCOUNTER — Emergency Department (HOSPITAL_COMMUNITY): Payer: 59

## 2018-03-22 ENCOUNTER — Emergency Department (HOSPITAL_COMMUNITY)
Admission: EM | Admit: 2018-03-22 | Discharge: 2018-03-23 | Disposition: A | Discharge status: Home / Self Care | Payer: 59 | Attending: Emergency Medicine | Admitting: Emergency Medicine

## 2018-03-22 DIAGNOSIS — K6289 Other specified diseases of anus and rectum: Secondary | ICD-10-CM | POA: Diagnosis present

## 2018-03-22 DIAGNOSIS — Z79899 Other long term (current) drug therapy: Secondary | ICD-10-CM | POA: Insufficient documentation

## 2018-03-22 LAB — POC OCCULT BLOOD, ED: FECAL OCCULT BLD: NEGATIVE

## 2018-03-22 LAB — CBC WITH DIFFERENTIAL/PLATELET
BASOS PCT: 0 %
Basophils Absolute: 0 10*3/uL (ref 0.0–0.1)
Eosinophils Absolute: 0.1 10*3/uL (ref 0.0–0.7)
Eosinophils Relative: 2 %
HEMATOCRIT: 39.2 % (ref 39.0–52.0)
HEMOGLOBIN: 12.4 g/dL — AB (ref 13.0–17.0)
LYMPHS ABS: 1.7 10*3/uL (ref 0.7–4.0)
LYMPHS PCT: 25 %
MCH: 22.9 pg — AB (ref 26.0–34.0)
MCHC: 31.6 g/dL (ref 30.0–36.0)
MCV: 72.3 fL — AB (ref 78.0–100.0)
MONO ABS: 1.1 10*3/uL — AB (ref 0.1–1.0)
MONOS PCT: 16 %
NEUTROS PCT: 57 %
Neutro Abs: 3.8 10*3/uL (ref 1.7–7.7)
Platelets: 241 10*3/uL (ref 150–400)
RBC: 5.42 MIL/uL (ref 4.22–5.81)
RDW: 17.4 % — AB (ref 11.5–15.5)
WBC: 6.6 10*3/uL (ref 4.0–10.5)

## 2018-03-22 LAB — I-STAT CHEM 8, ED
BUN: 7 mg/dL (ref 6–20)
CREATININE: 1 mg/dL (ref 0.61–1.24)
Calcium, Ion: 1.2 mmol/L (ref 1.15–1.40)
Chloride: 102 mmol/L (ref 101–111)
GLUCOSE: 88 mg/dL (ref 65–99)
HEMATOCRIT: 40 % (ref 39.0–52.0)
Hemoglobin: 13.6 g/dL (ref 13.0–17.0)
Potassium: 3.7 mmol/L (ref 3.5–5.1)
Sodium: 139 mmol/L (ref 135–145)
TCO2: 28 mmol/L (ref 22–32)

## 2018-03-22 LAB — COMPREHENSIVE METABOLIC PANEL
ALT: 11 U/L — AB (ref 17–63)
AST: 17 U/L (ref 15–41)
Albumin: 3.9 g/dL (ref 3.5–5.0)
Alkaline Phosphatase: 72 U/L (ref 38–126)
Anion gap: 6 (ref 5–15)
BILIRUBIN TOTAL: 1 mg/dL (ref 0.3–1.2)
BUN: 9 mg/dL (ref 6–20)
CO2: 28 mmol/L (ref 22–32)
CREATININE: 1.09 mg/dL (ref 0.61–1.24)
Calcium: 9.2 mg/dL (ref 8.9–10.3)
Chloride: 105 mmol/L (ref 101–111)
Glucose, Bld: 95 mg/dL (ref 65–99)
Potassium: 3.8 mmol/L (ref 3.5–5.1)
Sodium: 139 mmol/L (ref 135–145)
TOTAL PROTEIN: 7.8 g/dL (ref 6.5–8.1)

## 2018-03-22 MED ORDER — IOPAMIDOL (ISOVUE-300) INJECTION 61%
INTRAVENOUS | Status: AC
  Administered 2018-03-22: 100 mL via INTRAVENOUS
  Filled 2018-03-22: qty 100

## 2018-03-22 MED ORDER — MORPHINE SULFATE (PF) 4 MG/ML IV SOLN
4.0000 mg | Freq: Once | INTRAVENOUS | Status: AC
  Administered 2018-03-22: 4 mg via INTRAVENOUS
  Filled 2018-03-22: qty 1

## 2018-03-22 MED ORDER — IOPAMIDOL (ISOVUE-300) INJECTION 61%
30.0000 mL | Freq: Once | INTRAVENOUS | Status: AC | PRN
  Administered 2018-03-22: 100 mL via INTRAVENOUS

## 2018-03-22 MED ORDER — SODIUM CHLORIDE 0.9 % IV BOLUS
500.0000 mL | Freq: Once | INTRAVENOUS | Status: AC
  Administered 2018-03-22: 500 mL via INTRAVENOUS

## 2018-03-22 MED ORDER — IOPAMIDOL (ISOVUE-300) INJECTION 61%
INTRAVENOUS | Status: AC
  Filled 2018-03-22: qty 30

## 2018-03-22 NOTE — ED Triage Notes (Signed)
Rectal pain for a couple of days last time had an abscess in rectum states feels nauseated no fever noted states painful sitting no bleeding voiced.

## 2018-03-22 NOTE — ED Provider Notes (Signed)
[facility]-EMERGENCY DEPT Provider Note   CSN: [id number] Arrival date & time: 03/22/18  1918     History   Chief Complaint Chief Complaint  Patient presents with  . Rectal Pain    HPI Jared Marshall is a 22 y.o. male with history of Crohn's and perirectal abscess, followed by [facility], who presents the emergency department today for rectal pain.  Patient states over the last 1 week he has been having increasing pain around his rectum.  He states this is similar to pain to when he had a perirectal abscess in the past.  He notes that he does not see any changes of his skin around that area or swelling.  He does not take anything for symptoms.  He reports that his pain is worsened with sitting and also with palpation.  He notes increased "pressure" with bowel movements but no significant pain.  He has not take anything for symptoms.  He is chronically on Humira for his Crohn's disease.  Patient denies any fever, chills, abdominal pain, diarrhea, constipation, drainage, hematochezia, melena, or any other symptoms at this time.  Patient is not currently on any steroid therapy.  HPI  Past Medical History:  Diagnosis Date  . Abnormal colonoscopy   . Asthma   . Crohn's disease [facility])     Patient Active Problem List   Diagnosis Date Noted  . Crohn's disease (HCC) 02/02/2017  . Perirectal abscess 01/30/2017    Past Surgical History:  Procedure Laterality Date  . INCISION AND DRAINAGE PERIRECTAL ABSCESS N/A 01/30/2017   Procedure: EXAM UNDER ANESTHESIA, IRRIGATION AND DEBRIDEMENT PERIRECTAL ABSCESS;  Surgeon: Luretha Murphy, MD;  Location: [facility] ORS;  Service: General;  Laterality: N/A;        Home Medications    Prior to Admission medications   Medication Sig Start Date End Date Taking? Authorizing Provider  acetaminophen (TYLENOL) 500 MG tablet Take 2,000 mg by mouth every 6 (six) hours as needed (migraine).   Yes [provider]  Adalimumab (HUMIRA) 40  MG/0.8ML PSKT Inject 1 Dose into the skin every 14 (fourteen) days.   Yes [provider]  oxyCODONE-acetaminophen (PERCOCET/ROXICET) 5-325 MG tablet Take 1-2 tablets by mouth every 4 (four) hours as needed for moderate pain. Patient not taking: Reported on 03/22/2018 02/01/17   Avel Peace, MD  predniSONE (DELTASONE) 10 MG tablet Take 30 mg by mouth daily with breakfast.    [provider]    Family History History reviewed. No pertinent family history.  Social History Social History   Tobacco Use  . Smoking status: Never Smoker  . Smokeless tobacco: Never Used  Substance Use Topics  . Alcohol use: No  . Drug use: No     Allergies   Mesalamine   Review of Systems Review of Systems  All other systems reviewed and are negative.    Physical Exam Updated Vital Signs BP 137/89 (BP Location: Right Arm)   Pulse 90   Temp 98.3 F (36.8 C) (Oral)   Resp 15   Ht 5\' 11"  (1.803 m)   Wt 72.6 kg (160 lb)   SpO2 100%   BMI 22.32 kg/m   Physical Exam  Constitutional: He appears well-developed and well-nourished.  HENT:  Head: Normocephalic and atraumatic.  Right Ear: External ear normal.  Left Ear: External ear normal.  Nose: Nose normal.  Mouth/Throat: Uvula is midline, oropharynx is clear and moist and mucous membranes are normal. No tonsillar exudate.  Eyes: Pupils are equal,  round, and reactive to light. Right eye exhibits no discharge. Left eye exhibits no discharge. No scleral icterus.  Neck: Trachea normal. Neck supple. No spinous process tenderness present. No neck rigidity. Normal range of motion present.  Cardiovascular: Normal rate, regular rhythm and intact distal pulses.  No murmur heard. Pulses:      Radial pulses are 2+ on the right side, and 2+ on the left side.       Dorsalis pedis pulses are 2+ on the right side, and 2+ on the left side.       Posterior tibial pulses are 2+ on the right side, and 2+ on the left side.  No lower  extremity swelling or edema. Calves symmetric in size bilaterally.  Pulmonary/Chest: Effort normal and breath sounds normal. He exhibits no tenderness.  Abdominal: Soft. Bowel sounds are normal. He exhibits no distension. There is no tenderness. There is no rigidity, no rebound, no guarding and no CVA tenderness.  Genitourinary:  Genitourinary Comments: Chaperone was present.  Patient with significant pain around the rectal area. There is no erythema, swelling, induration of the skin.  There is no pain of the perineum and no evidence of Fournier's. Perianal sensory intact. No external fissure's palpated or examined. No external hemorrhoids noted. Digital Rectal Exam with significant tenderness.  No obvious fluctuance palpated.  Sphincter reveals good tone.  Masses palpated. No masses palpated. Stool color is brown with no overt blood or melena.  Musculoskeletal: He exhibits no edema.  Lymphadenopathy:    He has no cervical adenopathy.  Neurological: He is alert.  Skin: Skin is warm and dry. No rash noted. He is not diaphoretic.  Psychiatric: He has a normal mood and affect.  Nursing note and vitals reviewed.    ED Treatments / Results  Labs (all labs ordered are listed, but only abnormal results are displayed) Labs Reviewed  CBC WITH DIFFERENTIAL/PLATELET - Abnormal; Notable for the following components:      Result Value   Hemoglobin 12.4 (*)    MCV 72.3 (*)    MCH 22.9 (*)    RDW 17.4 (*)    Monocytes Absolute 1.1 (*)    All other components within normal limits  COMPREHENSIVE METABOLIC PANEL - Abnormal; Notable for the following components:   ALT 11 (*)    All other components within normal limits  OCCULT BLOOD X 1 CARD TO LAB, STOOL  I-STAT CHEM 8, ED  POC OCCULT BLOOD, ED    EKG None  Radiology No results found.  Procedures Procedures (including critical care time)  Medications Ordered in ED Medications  sodium chloride 0.9 % bolus 500 mL (has no administration in  time range)  morphine 4 MG/ML injection 4 mg (has no administration in time range)     Initial Impression / Assessment and Plan / ED Course  I have reviewed the triage vital signs and the nursing notes.  Pertinent labs & imaging results that were available during my care of the patient were reviewed by me and considered in my medical decision making (see chart for details).     22 y.o. male resenting with rectal pain over the last several days.  Denies any abdominal pain, fever, diarrhea, melena, hematochezia, emesis.  On exam there is no obvious drainable external abscess.  Patient does have significant pain surrounding the rectal area as well as on digital rectal exam.  There is no evidence of thrombosed external hemorrhoids or fissure on exam.  Concern for deeper abscess will  obtain CT of the pelvis with contrast.  Occult negative.  Screening labs are reassuring.  No leukocytosis.  Noted anemia but this is up from patient's baseline.  No significant electrolyte derangements.  No evidence of DKA.  Normal kidney function.  Pain currently controlled in the department.  Fluid given.  Pending CT scan case signed out to Antony Madura, PA-C with disposition given results of CT scan.   Final Clinical Impressions(s) / ED Diagnoses   Final diagnoses:  None    ED Discharge Orders    None       Princella Pellegrini 03/22/18 2243    Pricilla Loveless, MD 03/22/18 [phone]

## 2018-03-23 ENCOUNTER — Emergency Department (HOSPITAL_COMMUNITY)
Admission: EM | Admit: 2018-03-23 | Discharge: 2018-03-23 | Discharge status: Absent without leave | Payer: 59 | Source: Home / Self Care

## 2018-03-23 ENCOUNTER — Emergency Department (HOSPITAL_COMMUNITY)
Admission: EM | Admit: 2018-03-23 | Discharge: 2018-03-24 | Disposition: A | Discharge status: Home / Self Care | Payer: 59 | Attending: Emergency Medicine | Admitting: Emergency Medicine

## 2018-03-23 ENCOUNTER — Emergency Department (HOSPITAL_COMMUNITY): Payer: 59

## 2018-03-23 ENCOUNTER — Encounter (HOSPITAL_COMMUNITY): Payer: Self-pay | Admitting: Emergency Medicine

## 2018-03-23 DIAGNOSIS — J45909 Unspecified asthma, uncomplicated: Secondary | ICD-10-CM | POA: Insufficient documentation

## 2018-03-23 DIAGNOSIS — N50812 Left testicular pain: Secondary | ICD-10-CM

## 2018-03-23 DIAGNOSIS — Z79899 Other long term (current) drug therapy: Secondary | ICD-10-CM | POA: Insufficient documentation

## 2018-03-23 DIAGNOSIS — N451 Epididymitis: Secondary | ICD-10-CM | POA: Diagnosis not present

## 2018-03-23 MED ORDER — SULFAMETHOXAZOLE-TRIMETHOPRIM 800-160 MG PO TABS
1.0000 | ORAL_TABLET | Freq: Once | ORAL | Status: AC
  Administered 2018-03-23: 1 via ORAL
  Filled 2018-03-23: qty 1

## 2018-03-23 MED ORDER — CEPHALEXIN 500 MG PO CAPS: 500.0000 mg | ORAL_CAPSULE | Freq: Four times a day (QID) | ORAL | 0 refills | Status: AC

## 2018-03-23 MED ORDER — CEPHALEXIN 500 MG PO CAPS
500.0000 mg | ORAL_CAPSULE | Freq: Once | ORAL | Status: AC
  Administered 2018-03-23: 500 mg via ORAL
  Filled 2018-03-23: qty 1

## 2018-03-23 MED ORDER — SULFAMETHOXAZOLE-TRIMETHOPRIM 800-160 MG PO TABS: 1.0000 | ORAL_TABLET | Freq: Two times a day (BID) | ORAL | 0 refills | Status: AC

## 2018-03-23 NOTE — Discharge Instructions (Signed)
Take Bactrim and Keflex for infection coverage. We advise follow up with general surgery in 2 days for recheck of the area. There is no indication for drainage now. Take Tylenol as needed for pain control. Also apply warm compresses 3-4 times per day to try and promote drainage. You may return for new or concerning symptoms.

## 2018-03-23 NOTE — ED Triage Notes (Signed)
Pt reports that he was here yesterday for abscess in rectum and chron's flare up he didn't notice the knot on left testicle until today.

## 2018-03-23 NOTE — ED Provider Notes (Signed)
[facility]-EMERGENCY DEPT Provider Note   CSN: [id number] Arrival date & time: 03/23/18  1816     History   Chief Complaint Chief Complaint  Patient presents with  . knot on testicle    HPI Jared Marshall is a 22 y.o. male.  The history is provided by the patient and medical records.     22 year old male with history of asthma, Crohn's disease, history of perirectal abscess currently on antibiotics, presenting to the ED with a "knot" on his left testicle.  States he was in the ED yesterday for follow-up of his perirectal abscess, but did not noticed this area on his testicle until this morning.  States area is tender to the touch, particularly when it rubs up against his leg or when sitting upright.  He denies any fever or chills.  No difficulty urinating, but states he has been peeing more frequently.  No penile discharge.  Unsure of STD status.  He has not had any test which she passed.  He did take some Tylenol for pain which helped.  Past Medical History:  Diagnosis Date  . Abnormal colonoscopy   . Asthma   . Crohn's disease [facility])     Patient Active Problem List   Diagnosis Date Noted  . Crohn's disease (HCC) 02/02/2017  . Perirectal abscess 01/30/2017    Past Surgical History:  Procedure Laterality Date  . INCISION AND DRAINAGE PERIRECTAL ABSCESS N/A 01/30/2017   Procedure: EXAM UNDER ANESTHESIA, IRRIGATION AND DEBRIDEMENT PERIRECTAL ABSCESS;  Surgeon: Luretha Murphy, MD;  Location: [facility] ORS;  Service: General;  Laterality: N/A;        Home Medications    Prior to Admission medications   Medication Sig Start Date End Date Taking? Authorizing Provider  acetaminophen (TYLENOL) 500 MG tablet Take 2,000 mg by mouth every 6 (six) hours as needed (migraine).   Yes [provider]  Adalimumab (HUMIRA) 40 MG/0.8ML PSKT Inject 1 Dose into the skin every 14 (fourteen) days.   Yes [provider]  cephALEXin (KEFLEX) 500 MG capsule Take 1  capsule (500 mg total) by mouth 4 (four) times daily. 03/23/18  Yes Antony Madura, PA-C  sulfamethoxazole-trimethoprim (BACTRIM DS,SEPTRA DS) 800-160 MG tablet Take 1 tablet by mouth 2 (two) times daily for 7 days. 03/23/18 03/30/18 Yes Antony Madura, PA-C    Family History No family history on file.  Social History Social History   Tobacco Use  . Smoking status: Never Smoker  . Smokeless tobacco: Never Used  Substance Use Topics  . Alcohol use: No  . Drug use: No     Allergies   Mesalamine   Review of Systems Review of Systems  Genitourinary: Positive for testicular pain.  All other systems reviewed and are negative.    Physical Exam Updated Vital Signs BP 131/82 (BP Location: Right Arm)   Pulse 91   Temp 98 F (36.7 C) (Oral)   Resp 17   SpO2 99%   Physical Exam  Constitutional: He is oriented to person, place, and time. He appears well-developed and well-nourished.  HENT:  Head: Normocephalic and atraumatic.  Mouth/Throat: Oropharynx is clear and moist.  Eyes: Pupils are equal, round, and reactive to light. Conjunctivae and EOM are normal.  Neck: Normal range of motion.  Cardiovascular: Normal rate, regular rhythm and normal heart sounds.  Pulmonary/Chest: Effort normal and breath sounds normal.  Abdominal: Soft. Bowel sounds are normal.  Genitourinary: Left testis shows tenderness.  Genitourinary Comments: Left testicle with area of  swelling along inferior medial margin, this area is locally tender, no discrete mass palpated, normal lie of testicle, no penile discharge.  Musculoskeletal: Normal range of motion.  Neurological: He is alert and oriented to person, place, and time.  Skin: Skin is warm and dry.  Psychiatric: He has a normal mood and affect.  Nursing note and vitals reviewed.    ED Treatments / Results  Labs (all labs ordered are listed, but only abnormal results are displayed) Labs Reviewed  URINALYSIS, ROUTINE W REFLEX MICROSCOPIC - Abnormal;  Notable for the following components:      Result Value   Color, Urine YELLOW (*)    APPearance CLOUDY (*)    Hgb urine dipstick TRACE (*)    Protein, ur 30 (*)    Leukocytes, UA TRACE (*)    All other components within normal limits  URINALYSIS, MICROSCOPIC (REFLEX) - Abnormal; Notable for the following components:   Bacteria, UA RARE (*)    Squamous Epithelial / LPF 0-5 (*)    All other components within normal limits  GC/CHLAMYDIA PROBE AMP ([place]) NOT AT [facility]    EKG None  Radiology Ct Pelvis W Contrast  Result Date: 03/22/2018 CLINICAL DATA:  Rectal pain for couple of days. Nausea. History of rectal abscess. EXAM: CT PELVIS WITH CONTRAST TECHNIQUE: Multidetector CT imaging of the pelvis was performed using the standard protocol following the bolus administration of intravenous contrast. CONTRAST:  100 mL Isovue-300 COMPARISON:  01/30/2017 FINDINGS: Urinary Tract:  No abnormality visualized. Bowel: Unremarkable visualized pelvic bowel loops. Appendix is normal. The perianal fluid collection seen on the previous study has generally resolved. There is a suggestion of a possible 8 mm residual collection in the right perianal region at about the 8 o'clock position. This could represent a small residual abscess but there was not any significant stranding or fat change in this area. Vascular/Lymphatic: No pathologically enlarged lymph nodes. No significant vascular abnormality seen. Reproductive: Prostate gland is not enlarged. Mild prominence of seminal vesicles, likely normal variation. Other:  No free air or free fluid demonstrated in the pelvis. Musculoskeletal: Visualized bones appear intact. No destructive bone lesions. IMPRESSION: Interval improvement of the previous perianal abscess with possible small residual 8 mm collection in the right perianal region. No evidence of developing fistula or cellulitis. Electronically Signed   By: Burman Nieves M.D.   On: 03/22/2018 23:34     Procedures Procedures (including critical care time)  Medications Ordered in ED Medications  ibuprofen (ADVIL,MOTRIN) tablet 800 mg (800 mg Oral Given 03/24/18 0010)  cefTRIAXone (ROCEPHIN) injection 250 mg (250 mg Intramuscular Given 03/24/18 0223)  azithromycin (ZITHROMAX) tablet 1,000 mg (1,000 mg Oral Given 03/24/18 0223)     Initial Impression / Assessment and Plan / ED Course  I have reviewed the triage vital signs and the nursing notes.  Pertinent labs & imaging results that were available during my care of the patient were reviewed by me and considered in my medical decision making (see chart for details).  22 year old male presenting to the ED with left testicle pain.  Was here yesterday for ongoing evaluation of perirectal abscess, and did not have any testicle symptoms until this morning.  He is afebrile and nontoxic.  Does have areas swelling to the left inferior medial testicle without discrete mass.  Normal lie.  No penile discharge.  Does report some urinary frequency.  UA with some WBCs, rare bacteria.  Gc/chl pending. Ultrasound was obtained, consistent with epididymitis.  Given his  age, will require treatment for STDs.  He was given Rocephin and azithromycin here.  He was started on Bactrim and Keflex yesterday, will discontinue Keflex and replace with doxycycline for coverage of epididymitis.  Given urology follow-up if any ongoing issues.  He understands to return here for any new/acute changes.  Final Clinical Impressions(s) / ED Diagnoses   Final diagnoses:  Pain in left testicle  Epididymitis    ED Discharge Orders        Ordered    doxycycline (VIBRAMYCIN) 100 MG capsule  2 times daily     03/24/18 0246       Garlon Hatchet, PA-C 03/24/18 4098    Rolland Porter, MD 03/24/18 1055

## 2018-03-23 NOTE — ED Provider Notes (Signed)
1:00 AM Patient care assumed from [facility], PA-C at change of shift pending CT scan.  Patient with history of perianal abscess requiring surgical drainage.  He has been experiencing rectal pain similar to prior abscess.  Patient afebrile today with stable vital signs.  He had no drainable fluid collection on exam by prior provider.  CT scan pending to evaluate for abscess presents.  Imaging today shows improvement compared to prior abscess with small residual 8 mm collection in the right perianal region.  Given that fluid collection may be secondary to recurrent abscess, will place on antibiotics.  No indication for bedside incision and drainage at this time.  The patient has been instructed to follow-up with his general surgeon for wound reassessment.  Return precautions discussed and provided. Patient discharged in stable condition with no unaddressed concerns.   Results for orders placed or performed during the hospital encounter of 03/22/18  CBC with Differential  Result Value Ref Range   WBC 6.6 4.0 - 10.5 K/uL   RBC 5.42 4.22 - 5.81 MIL/uL   Hemoglobin 12.4 (L) 13.0 - 17.0 g/dL   HCT 16.1 09.6 - 04.5 %   MCV 72.3 (L) 78.0 - 100.0 fL   MCH 22.9 (L) 26.0 - 34.0 pg   MCHC 31.6 30.0 - 36.0 g/dL   RDW 40.9 (H) 81.1 - 91.4 %   Platelets 241 150 - 400 K/uL   Neutrophils Relative % 57 %   Neutro Abs 3.8 1.7 - 7.7 K/uL   Lymphocytes Relative 25 %   Lymphs Abs 1.7 0.7 - 4.0 K/uL   Monocytes Relative 16 %   Monocytes Absolute 1.1 (H) 0.1 - 1.0 K/uL   Eosinophils Relative 2 %   Eosinophils Absolute 0.1 0.0 - 0.7 K/uL   Basophils Relative 0 %   Basophils Absolute 0.0 0.0 - 0.1 K/uL  Comprehensive metabolic panel  Result Value Ref Range   Sodium 139 135 - 145 mmol/L   Potassium 3.8 3.5 - 5.1 mmol/L   Chloride 105 101 - 111 mmol/L   CO2 28 22 - 32 mmol/L   Glucose, Bld 95 65 - 99 mg/dL   BUN 9 6 - 20 mg/dL   Creatinine, Ser 7.82 0.61 - 1.24 mg/dL   Calcium 9.2 8.9 - 95.6 mg/dL   Total Protein 7.8 6.5 - 8.1 g/dL   Albumin 3.9 3.5 - 5.0 g/dL   AST 17 15 - 41 U/L   ALT 11 (L) 17 - 63 U/L   Alkaline Phosphatase 72 38 - 126 U/L   Total Bilirubin 1.0 0.3 - 1.2 mg/dL   GFR calc non Af Amer >60 >60 mL/min   GFR calc Af Amer >60 >60 mL/min   Anion gap 6 5 - 15  I-stat Chem 8, ED  Result Value Ref Range   Sodium 139 135 - 145 mmol/L   Potassium 3.7 3.5 - 5.1 mmol/L   Chloride 102 101 - 111 mmol/L   BUN 7 6 - 20 mg/dL   Creatinine, Ser 2.13 0.61 - 1.24 mg/dL   Glucose, Bld 88 65 - 99 mg/dL   Calcium, Ion 0.86 1.15 - 1.40 mmol/L   TCO2 28 22 - 32 mmol/L   Hemoglobin 13.6 13.0 - 17.0 g/dL   HCT 57.8 46.9 - 62.9 %  POC occult blood, ED  Result Value Ref Range   Fecal Occult Bld NEGATIVE NEGATIVE   Ct Pelvis W Contrast  Result Date: 03/22/2018 CLINICAL DATA:  Rectal pain for couple of  days. Nausea. History of rectal abscess. EXAM: CT PELVIS WITH CONTRAST TECHNIQUE: Multidetector CT imaging of the pelvis was performed using the standard protocol following the bolus administration of intravenous contrast. CONTRAST:  100 mL Isovue-300 COMPARISON:  01/30/2017 FINDINGS: Urinary Tract:  No abnormality visualized. Bowel: Unremarkable visualized pelvic bowel loops. Appendix is normal. The perianal fluid collection seen on the previous study has generally resolved. There is a suggestion of a possible 8 mm residual collection in the right perianal region at about the 8 o'clock position. This could represent a small residual abscess but there was not any significant stranding or fat change in this area. Vascular/Lymphatic: No pathologically enlarged lymph nodes. No significant vascular abnormality seen. Reproductive: Prostate gland is not enlarged. Mild prominence of seminal vesicles, likely normal variation. Other:  No free air or free fluid demonstrated in the pelvis. Musculoskeletal: Visualized bones appear intact. No destructive bone lesions. IMPRESSION: Interval improvement of the  previous perianal abscess with possible small residual 8 mm collection in the right perianal region. No evidence of developing fistula or cellulitis. Electronically Signed   By: Burman Nieves M.D.   On: 03/22/2018 23:34      Antony Madura, PA-C 03/23/18 0229    Palumbo, April, MD 03/23/18 [phone]

## 2018-03-23 NOTE — ED Notes (Signed)
Bed: WA06 Expected date:  Expected time:  Means of arrival:  Comments: 

## 2018-03-24 LAB — URINALYSIS, MICROSCOPIC (REFLEX)

## 2018-03-24 LAB — URINALYSIS, ROUTINE W REFLEX MICROSCOPIC
BILIRUBIN URINE: NEGATIVE
Glucose, UA: NEGATIVE mg/dL
KETONES UR: NEGATIVE mg/dL
NITRITE: NEGATIVE
PH: 7.5 (ref 5.0–8.0)
Protein, ur: 30 mg/dL — AB
SPECIFIC GRAVITY, URINE: 1.02 (ref 1.005–1.030)

## 2018-03-24 MED ORDER — AZITHROMYCIN 250 MG PO TABS
1000.0000 mg | ORAL_TABLET | Freq: Once | ORAL | Status: AC
  Administered 2018-03-24: 1000 mg via ORAL
  Filled 2018-03-24: qty 4

## 2018-03-24 MED ORDER — CEFTRIAXONE SODIUM 250 MG IJ SOLR
250.0000 mg | Freq: Once | INTRAMUSCULAR | Status: AC
  Administered 2018-03-24: 250 mg via INTRAMUSCULAR
  Filled 2018-03-24: qty 250

## 2018-03-24 MED ORDER — IBUPROFEN 800 MG PO TABS
800.0000 mg | ORAL_TABLET | Freq: Once | ORAL | Status: AC
  Administered 2018-03-24: 800 mg via ORAL
  Filled 2018-03-24: qty 1

## 2018-03-24 MED ORDER — DOXYCYCLINE HYCLATE 100 MG PO CAPS: 100.0000 mg | ORAL_CAPSULE | Freq: Two times a day (BID) | ORAL | 0 refills | Status: AC

## 2018-03-24 NOTE — Discharge Instructions (Signed)
Take the prescribed medication as directed.  Stop taking the keflex, continue bactrim. Follow-up with urology if symptoms do not seem to be improving. Return to the ED for new or worsening symptoms.

## 2019-01-02 IMAGING — US US ART/VEN ABD/PELV/SCROTUM DOPPLER LTD
1 series · 14 of 25 positions shown · non-contrast
Comparison: None.

CLINICAL DATA: Knot on the left side of the scrotum. History of
pain perianal abscess.

EXAM:
SCROTAL ULTRASOUND
DOPPLER ULTRASOUND OF THE TESTICLES
TECHNIQUE: Complete ultrasound examination of the testicles, epididymis, and
other scrotal structures was performed. Color and spectral Doppler
ultrasound were also utilized to evaluate blood flow to the
testicles.

[Series 1: us art/ven abd/pelv/scrotum doppler ltd · 0.08mm/px · 14 of 51 slices shown]
[im 1/51]
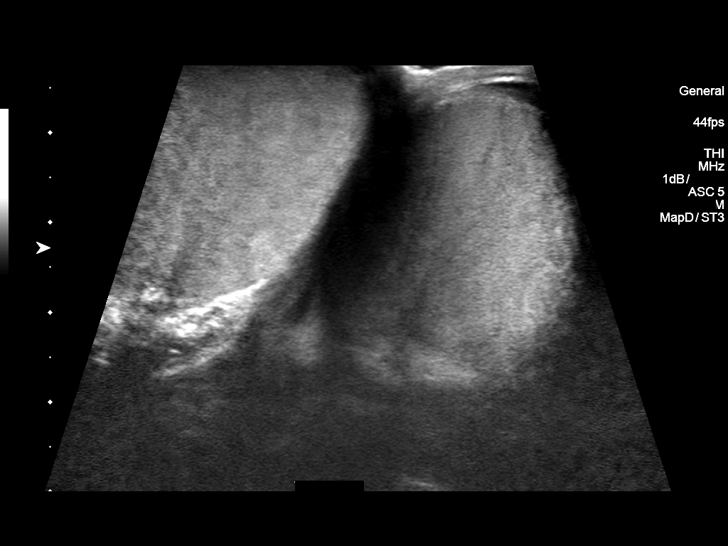
[im 5/51]
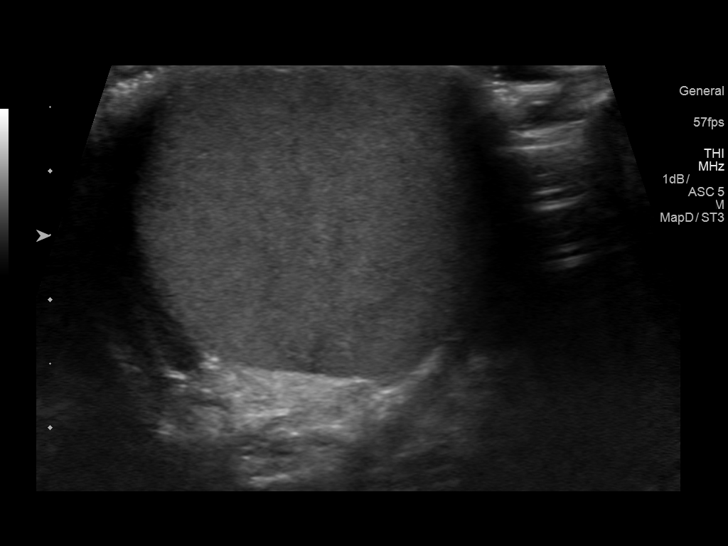
[im 9/51]
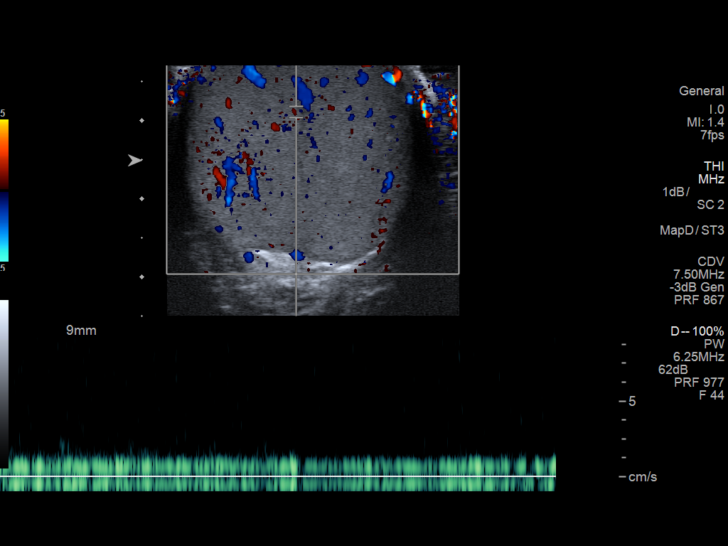
[im 13/51]
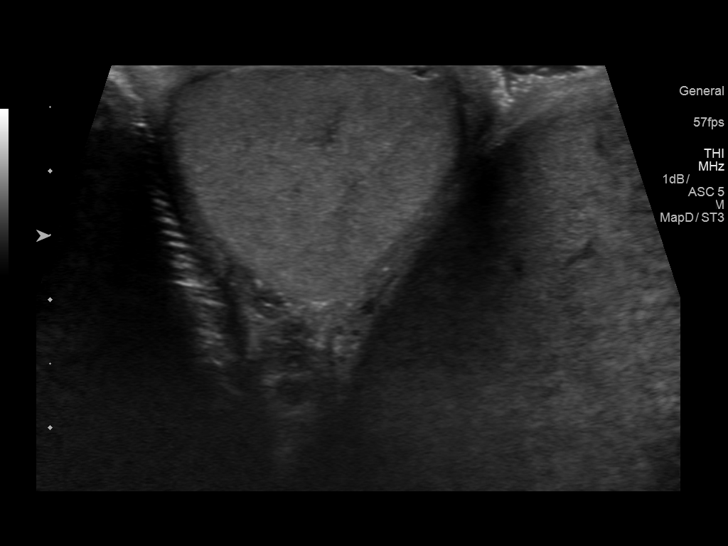
[im 17/51]
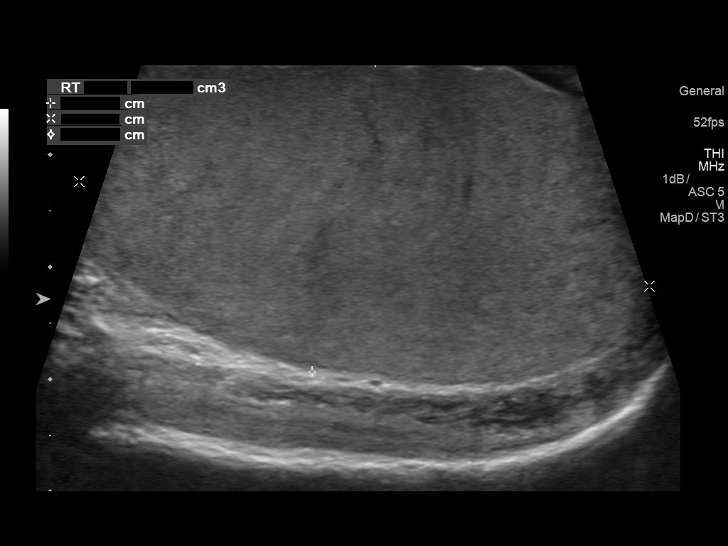
[im 19/51]
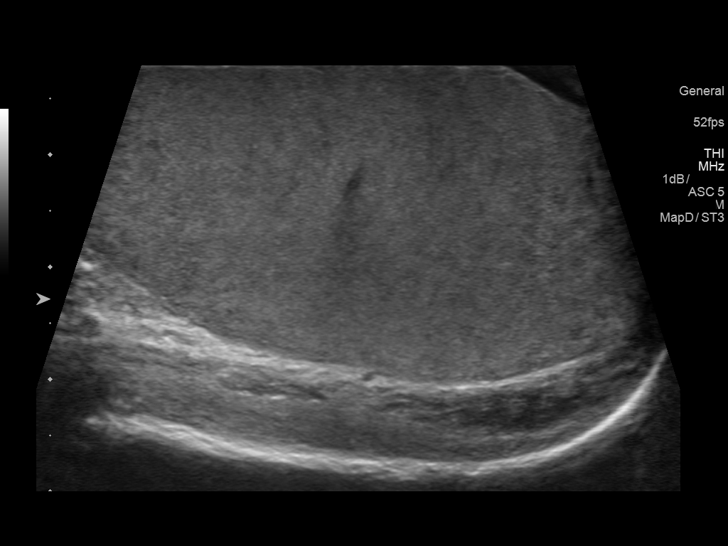
[im 23/51]
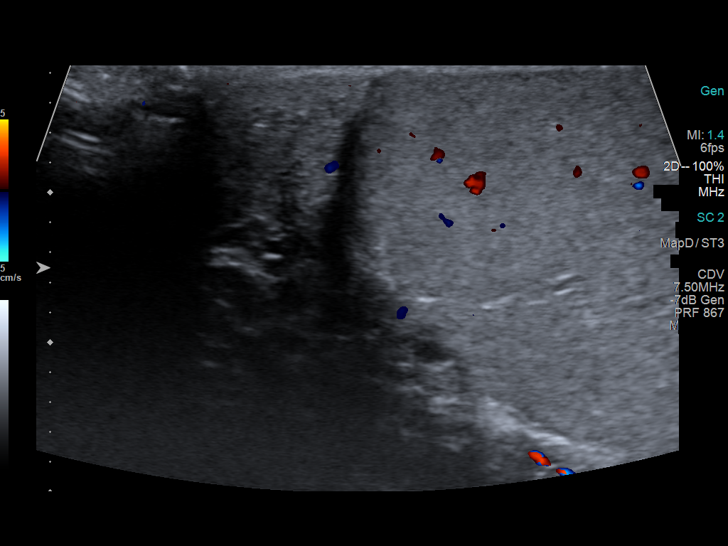
[im 28/51]
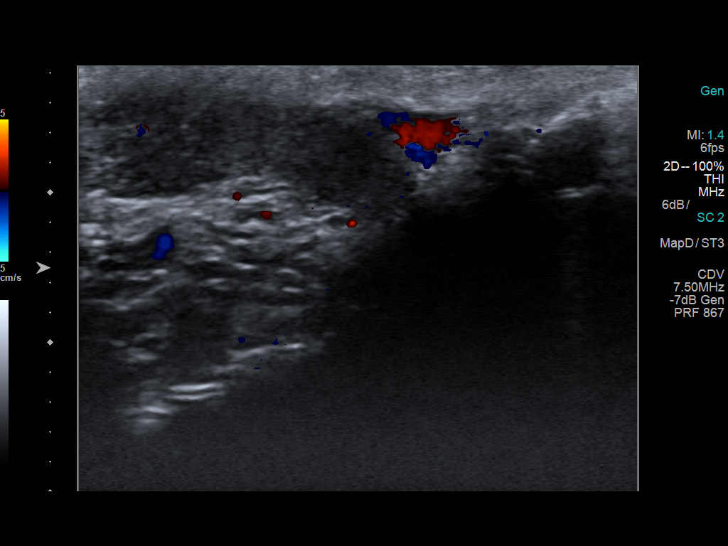
[im 32/51]
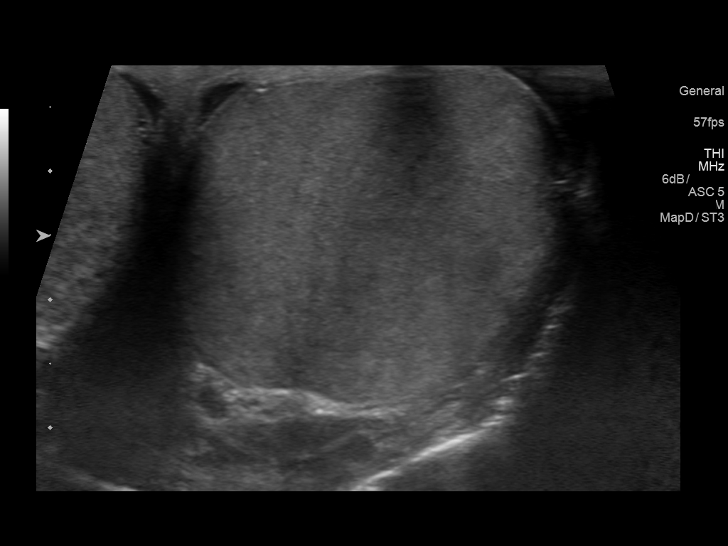
[im 34/51]
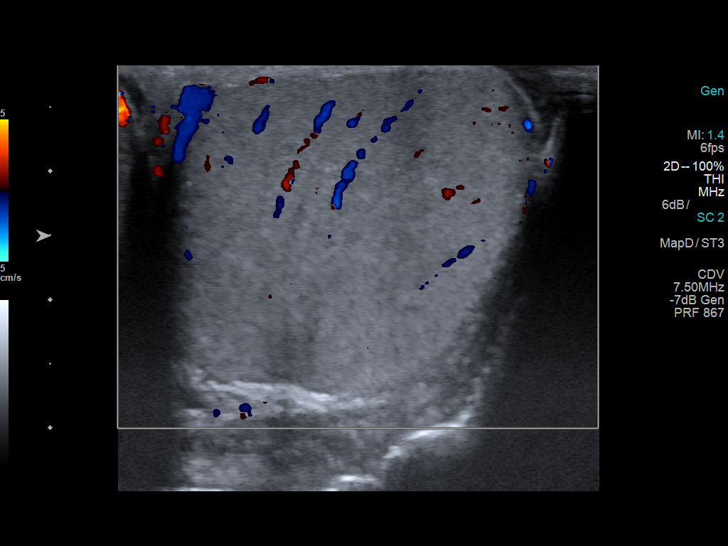
[im 38/51]
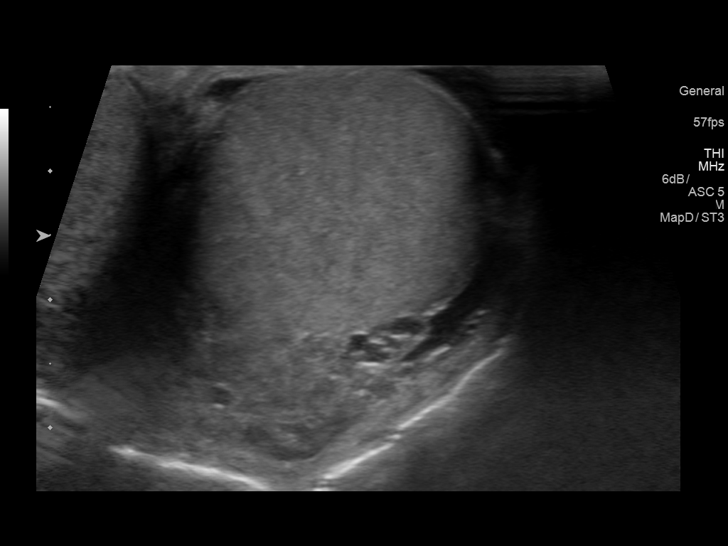
[im 42/51]
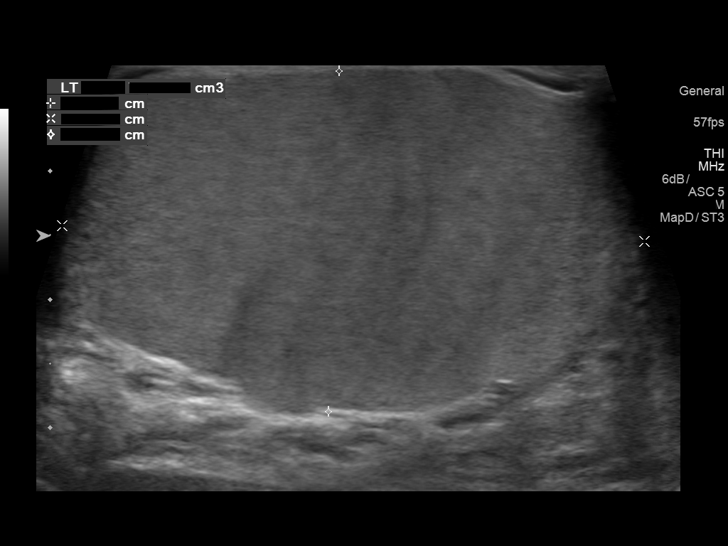
[im 46/51]
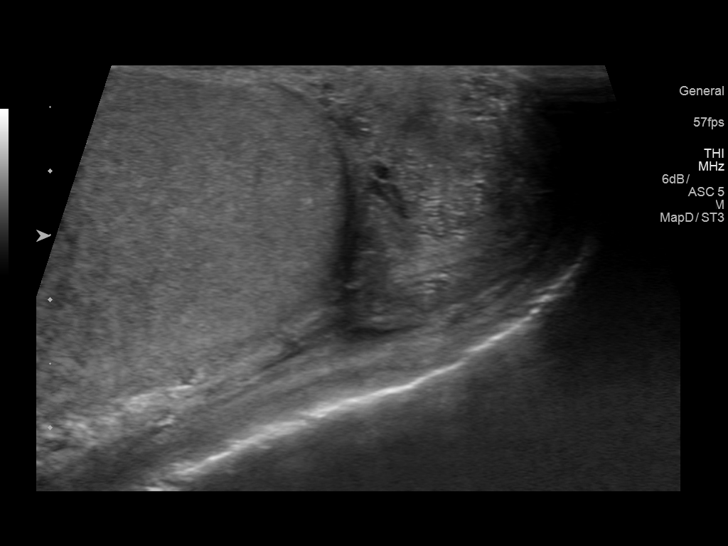
[im 51/51]
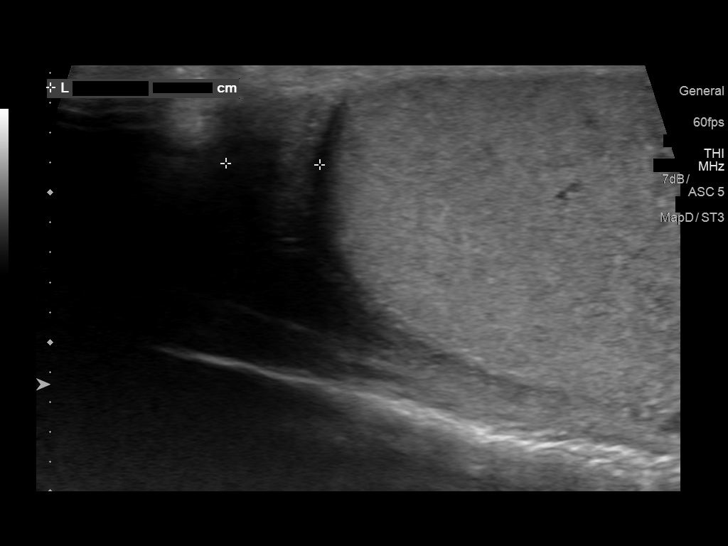

[14 of 25 positions shown; findings below may reference images not displayed]

FINDINGS: Right testicle

Measurements: 5.2 x 2.8 x 3.1 cm. No mass or microlithiasis
visualized.

Left testicle

Measurements: 4.6 x 2.7 x 2.9 cm. No mass or microlithiasis
visualized.

Right epididymis:  Normal in size and appearance.

Left epididymis: Corresponding with the palpable lump is mild
enlargement of the epididymal tail with hyperemia but without focal
mass.

Hydrocele:  None visualized.

Varicocele:  None visualized.

Pulsed Doppler interrogation of both testes demonstrates normal low
resistance arterial and venous waveforms bilaterally.
IMPRESSION: Enlarged hyperemic left epididymal tail corresponding with the area
of palpable abnormality felt by the patient. Findings suggest
epididymitis. No intra testicular mass. No torsion.
